# Patient Record
Sex: Female | Born: 1970 | Race: White | Hispanic: No | Marital: Married | Smoking: Never smoker
Health system: Southern US, Community
[De-identification: ages and names within clinical notes are randomized; demographics above are authoritative.]

## PROBLEM LIST (undated history)

## (undated) DIAGNOSIS — N939 Abnormal uterine and vaginal bleeding, unspecified: Secondary | ICD-10-CM

## (undated) DIAGNOSIS — R011 Cardiac murmur, unspecified: Secondary | ICD-10-CM

## (undated) DIAGNOSIS — Z950 Presence of cardiac pacemaker: Secondary | ICD-10-CM

## (undated) DIAGNOSIS — I472 Ventricular tachycardia, unspecified: Secondary | ICD-10-CM

## (undated) DIAGNOSIS — G43109 Migraine with aura, not intractable, without status migrainosus: Principal | ICD-10-CM

## (undated) DIAGNOSIS — G43909 Migraine, unspecified, not intractable, without status migrainosus: Secondary | ICD-10-CM

## (undated) DIAGNOSIS — F439 Reaction to severe stress, unspecified: Secondary | ICD-10-CM

## (undated) HISTORY — DX: Reaction to severe stress, unspecified: F43.9

## (undated) HISTORY — PX: ENDOMETRIAL ABLATION: SHX621

## (undated) HISTORY — DX: Ventricular tachycardia: I47.2

## (undated) HISTORY — DX: Ventricular tachycardia, unspecified: I47.20

## (undated) HISTORY — DX: Migraine with aura, not intractable, without status migrainosus: G43.109

## (undated) HISTORY — PX: DILATION AND CURETTAGE OF UTERUS: SHX78

## (undated) HISTORY — PX: LAPAROSCOPIC TOTAL HYSTERECTOMY: SUR800

## (undated) HISTORY — PX: WISDOM TOOTH EXTRACTION: SHX21

---

## 1995-07-23 HISTORY — PX: TUBAL LIGATION: SHX77

## 2006-08-08 ENCOUNTER — Other Ambulatory Visit: Admission: RE | Admit: 2006-08-08 | Discharge: 2006-08-08 | Payer: Self-pay | Admitting: Obstetrics & Gynecology

## 2007-11-04 ENCOUNTER — Other Ambulatory Visit: Admission: RE | Admit: 2007-11-04 | Discharge: 2007-11-04 | Payer: Self-pay | Admitting: Obstetrics & Gynecology

## 2011-08-28 ENCOUNTER — Encounter (HOSPITAL_COMMUNITY): Payer: Self-pay | Admitting: Pharmacist

## 2011-09-02 ENCOUNTER — Encounter (HOSPITAL_COMMUNITY): Payer: Self-pay

## 2011-09-02 ENCOUNTER — Encounter (HOSPITAL_COMMUNITY)
Admission: RE | Admit: 2011-09-02 | Discharge: 2011-09-02 | Disposition: A | Discharge status: Home / Self Care | Payer: Managed Care, Other (non HMO) | Source: Ambulatory Visit | Attending: Obstetrics & Gynecology | Admitting: Obstetrics & Gynecology

## 2011-09-02 HISTORY — DX: Abnormal uterine and vaginal bleeding, unspecified: N93.9

## 2011-09-02 LAB — SURGICAL PCR SCREEN: MRSA, PCR: NEGATIVE

## 2011-09-02 LAB — CBC
MCH: 31 pg (ref 26.0–34.0)
MCV: 92.1 fL (ref 78.0–100.0)
Platelets: 324 10*3/uL (ref 150–400)
RDW: 12.5 % (ref 11.5–15.5)
WBC: 7.3 10*3/uL (ref 4.0–10.5)

## 2011-09-02 NOTE — Patient Instructions (Signed)
   Your procedure is scheduled on: Tuesday, Feb 19th  Enter through the [employer] of [facility] at: [employer] up the phone at the desk and dial [phone] and inform us of your arrival.  Please call this number if you have any problems the morning of surgery: [phone]  Remember: Do not eat food after midnight:  Monday Do not drink clear liquids after:  Monday Take these medicines the morning of surgery with a SIP OF WATER: None  Do not wear jewelry, make-up, or FINGER nail polish Do not wear lotions, powders, perfumes or deodorant. Do not shave 48 hours prior to surgery. Do not bring valuables to the hospital.  Leave suitcase in the car. After Surgery it may be brought to your room. For patients being admitted to the hospital, checkout time is 11:00am the day of discharge. Home with Husband Trista Ciocca cell [phone]  Patients discharged on the day of surgery will not be allowed to drive home.     Remember to use your hibiclens as instructed.Please shower with 1/2 bottle the evening before your surgery and the other 1/2 bottle the morning of surgery.

## 2011-09-02 NOTE — Pre-Procedure Instructions (Signed)
Ok to see patient DOS. 

## 2011-09-09 MED ORDER — CEFAZOLIN SODIUM-DEXTROSE 2-3 GM-% IV SOLR
2.0000 g | INTRAVENOUS | Status: AC
  Administered 2011-09-10: 2 g via INTRAVENOUS
  Filled 2011-09-09: qty 50

## 2011-09-10 ENCOUNTER — Encounter (HOSPITAL_COMMUNITY): Payer: Self-pay | Admitting: *Deleted

## 2011-09-10 ENCOUNTER — Ambulatory Visit (HOSPITAL_COMMUNITY): Payer: Managed Care, Other (non HMO) | Admitting: Anesthesiology

## 2011-09-10 ENCOUNTER — Other Ambulatory Visit: Payer: Self-pay | Admitting: Obstetrics & Gynecology

## 2011-09-10 ENCOUNTER — Encounter (HOSPITAL_COMMUNITY): Payer: Self-pay | Admitting: Anesthesiology

## 2011-09-10 ENCOUNTER — Ambulatory Visit (HOSPITAL_COMMUNITY)
Admission: RE | Admit: 2011-09-10 | Discharge: 2011-09-10 | Disposition: A | Discharge status: Home / Self Care | Payer: Managed Care, Other (non HMO) | Source: Ambulatory Visit | Attending: Obstetrics & Gynecology | Admitting: Obstetrics & Gynecology

## 2011-09-10 ENCOUNTER — Encounter (HOSPITAL_COMMUNITY)
Admission: RE | Disposition: A | Discharge status: Home / Self Care | Payer: Self-pay | Source: Ambulatory Visit | Attending: Obstetrics & Gynecology

## 2011-09-10 DIAGNOSIS — N920 Excessive and frequent menstruation with regular cycle: Secondary | ICD-10-CM | POA: Diagnosis present

## 2011-09-10 DIAGNOSIS — L909 Atrophic disorder of skin, unspecified: Secondary | ICD-10-CM | POA: Insufficient documentation

## 2011-09-10 DIAGNOSIS — N938 Other specified abnormal uterine and vaginal bleeding: Secondary | ICD-10-CM | POA: Insufficient documentation

## 2011-09-10 DIAGNOSIS — N946 Dysmenorrhea, unspecified: Secondary | ICD-10-CM | POA: Diagnosis present

## 2011-09-10 DIAGNOSIS — N949 Unspecified condition associated with female genital organs and menstrual cycle: Secondary | ICD-10-CM | POA: Insufficient documentation

## 2011-09-10 HISTORY — PX: CYSTOSCOPY: SHX5120

## 2011-09-10 SURGERY — ROBOTIC ASSISTED TOTAL HYSTERECTOMY
Anesthesia: General | Site: Bladder | Wound class: Clean Contaminated

## 2011-09-10 MED ORDER — PROMETHAZINE HCL 25 MG/ML IJ SOLN: 12.5000 mg | INTRAMUSCULAR | Status: DC | PRN

## 2011-09-10 MED ORDER — ROCURONIUM BROMIDE 50 MG/5ML IV SOLN
INTRAVENOUS | Status: AC
  Filled 2011-09-10: qty 2

## 2011-09-10 MED ORDER — INDIGOTINDISULFONATE SODIUM 8 MG/ML IJ SOLN
INTRAMUSCULAR | Status: DC | PRN
  Administered 2011-09-10: 5 mL via INTRAVENOUS

## 2011-09-10 MED ORDER — LIDOCAINE HCL (CARDIAC) 20 MG/ML IV SOLN
INTRAVENOUS | Status: DC | PRN
  Administered 2011-09-10: 50 mg via INTRAVENOUS

## 2011-09-10 MED ORDER — NEOSTIGMINE METHYLSULFATE 1 MG/ML IJ SOLN
INTRAMUSCULAR | Status: DC | PRN
  Administered 2011-09-10: 4 mg via INTRAVENOUS

## 2011-09-10 MED ORDER — FENTANYL CITRATE 0.05 MG/ML IJ SOLN
INTRAMUSCULAR | Status: DC | PRN
  Administered 2011-09-10: 100 ug via INTRAVENOUS
  Administered 2011-09-10: 50 ug via INTRAVENOUS
  Administered 2011-09-10: 100 ug via INTRAVENOUS
  Administered 2011-09-10: 50 ug via INTRAVENOUS
  Administered 2011-09-10: 100 ug via INTRAVENOUS

## 2011-09-10 MED ORDER — FENTANYL CITRATE 0.05 MG/ML IJ SOLN
INTRAMUSCULAR | Status: AC
  Filled 2011-09-10: qty 5

## 2011-09-10 MED ORDER — HYDROMORPHONE HCL PF 1 MG/ML IJ SOLN
INTRAMUSCULAR | Status: AC
  Filled 2011-09-10: qty 1

## 2011-09-10 MED ORDER — KETOROLAC TROMETHAMINE 30 MG/ML IJ SOLN: 30.0000 mg | Freq: Four times a day (QID) | INTRAMUSCULAR | Status: DC

## 2011-09-10 MED ORDER — GLYCOPYRROLATE 0.2 MG/ML IJ SOLN
INTRAMUSCULAR | Status: DC | PRN
  Administered 2011-09-10: .8 mg via INTRAVENOUS

## 2011-09-10 MED ORDER — PHENYLEPHRINE HCL 10 MG/ML IJ SOLN: INTRAMUSCULAR | Status: DC | PRN

## 2011-09-10 MED ORDER — STERILE WATER FOR IRRIGATION IR SOLN
Status: DC | PRN
  Administered 2011-09-10: 1000 mL via INTRAVESICAL

## 2011-09-10 MED ORDER — LACTATED RINGERS IV SOLN
INTRAVENOUS | Status: DC
  Administered 2011-09-10: 10:00:00 via INTRAVENOUS
  Administered 2011-09-10: 125 mL/h via INTRAVENOUS

## 2011-09-10 MED ORDER — DEXAMETHASONE SODIUM PHOSPHATE 10 MG/ML IJ SOLN
INTRAMUSCULAR | Status: AC
  Filled 2011-09-10: qty 1

## 2011-09-10 MED ORDER — PROPOFOL 10 MG/ML IV EMUL
INTRAVENOUS | Status: AC
  Filled 2011-09-10: qty 20

## 2011-09-10 MED ORDER — ROCURONIUM BROMIDE 100 MG/10ML IV SOLN
INTRAVENOUS | Status: DC | PRN
  Administered 2011-09-10: 20 mg via INTRAVENOUS
  Administered 2011-09-10: 10 mg via INTRAVENOUS
  Administered 2011-09-10: 50 mg via INTRAVENOUS

## 2011-09-10 MED ORDER — ACETAMINOPHEN 325 MG PO TABS: 650.0000 mg | ORAL_TABLET | ORAL | Status: DC | PRN

## 2011-09-10 MED ORDER — GLYCOPYRROLATE 0.2 MG/ML IJ SOLN
INTRAMUSCULAR | Status: AC
  Filled 2011-09-10: qty 1

## 2011-09-10 MED ORDER — ROPIVACAINE HCL 5 MG/ML IJ SOLN
INTRAMUSCULAR | Status: AC
  Filled 2011-09-10: qty 60

## 2011-09-10 MED ORDER — MIDAZOLAM HCL 5 MG/5ML IJ SOLN
INTRAMUSCULAR | Status: DC | PRN
  Administered 2011-09-10: 2 mg via INTRAVENOUS

## 2011-09-10 MED ORDER — PANTOPRAZOLE SODIUM 40 MG IV SOLR
40.0000 mg | Freq: Every day | INTRAVENOUS | Status: DC
  Filled 2011-09-10: qty 40

## 2011-09-10 MED ORDER — EPHEDRINE 5 MG/ML INJ
INTRAVENOUS | Status: AC
  Filled 2011-09-10: qty 10

## 2011-09-10 MED ORDER — LACTATED RINGERS IR SOLN
Status: DC | PRN
  Administered 2011-09-10: 3000 mL

## 2011-09-10 MED ORDER — EPHEDRINE SULFATE 50 MG/ML IJ SOLN
INTRAMUSCULAR | Status: DC | PRN
  Administered 2011-09-10: 10 mg via INTRAVENOUS

## 2011-09-10 MED ORDER — ONDANSETRON HCL 4 MG/2ML IJ SOLN
INTRAMUSCULAR | Status: DC | PRN
  Administered 2011-09-10: 4 mg via INTRAVENOUS

## 2011-09-10 MED ORDER — PROMETHAZINE HCL 25 MG/ML IJ SOLN: 6.2500 mg | INTRAMUSCULAR | Status: DC | PRN

## 2011-09-10 MED ORDER — NEOSTIGMINE METHYLSULFATE 1 MG/ML IJ SOLN
INTRAMUSCULAR | Status: AC
  Filled 2011-09-10: qty 10

## 2011-09-10 MED ORDER — GLYCOPYRROLATE 0.2 MG/ML IJ SOLN
INTRAMUSCULAR | Status: AC
  Filled 2011-09-10: qty 2

## 2011-09-10 MED ORDER — TEMAZEPAM 15 MG PO CAPS: 15.0000 mg | ORAL_CAPSULE | Freq: Every evening | ORAL | Status: DC | PRN

## 2011-09-10 MED ORDER — HYDROMORPHONE HCL PF 1 MG/ML IJ SOLN
INTRAMUSCULAR | Status: DC | PRN
  Administered 2011-09-10: 1 mg via INTRAVENOUS

## 2011-09-10 MED ORDER — PROPOFOL 10 MG/ML IV EMUL
INTRAVENOUS | Status: DC | PRN
  Administered 2011-09-10: 200 mg via INTRAVENOUS

## 2011-09-10 MED ORDER — NALOXONE HCL 0.4 MG/ML IJ SOLN
INTRAMUSCULAR | Status: AC
  Filled 2011-09-10: qty 1

## 2011-09-10 MED ORDER — ROCURONIUM BROMIDE 50 MG/5ML IV SOLN
INTRAVENOUS | Status: AC
  Filled 2011-09-10: qty 1

## 2011-09-10 MED ORDER — DEXAMETHASONE SODIUM PHOSPHATE 4 MG/ML IJ SOLN
INTRAMUSCULAR | Status: DC | PRN
  Administered 2011-09-10: 10 mg via INTRAVENOUS

## 2011-09-10 MED ORDER — FLUMAZENIL 1 MG/10ML IV SOLN
INTRAVENOUS | Status: AC
  Filled 2011-09-10: qty 10

## 2011-09-10 MED ORDER — MIDAZOLAM HCL 2 MG/2ML IJ SOLN
INTRAMUSCULAR | Status: AC
  Filled 2011-09-10: qty 2

## 2011-09-10 MED ORDER — MORPHINE SULFATE 4 MG/ML IJ SOLN: 1.0000 mg | INTRAMUSCULAR | Status: DC | PRN

## 2011-09-10 MED ORDER — BUPIVACAINE HCL (PF) 0.25 % IJ SOLN
INTRAMUSCULAR | Status: AC
  Filled 2011-09-10: qty 30

## 2011-09-10 MED ORDER — ONDANSETRON HCL 4 MG/2ML IJ SOLN
INTRAMUSCULAR | Status: AC
  Filled 2011-09-10: qty 2

## 2011-09-10 MED ORDER — ACETAMINOPHEN 325 MG PO TABS: 325.0000 mg | ORAL_TABLET | ORAL | Status: DC | PRN

## 2011-09-10 MED ORDER — MENTHOL 3 MG MT LOZG: 1.0000 | LOZENGE | OROMUCOSAL | Status: DC | PRN

## 2011-09-10 MED ORDER — METOCLOPRAMIDE HCL 5 MG/ML IJ SOLN
INTRAMUSCULAR | Status: AC
  Filled 2011-09-10: qty 2

## 2011-09-10 MED ORDER — ROPIVACAINE HCL 5 MG/ML IJ SOLN
INTRAMUSCULAR | Status: DC | PRN
  Administered 2011-09-10: 69 mL

## 2011-09-10 MED ORDER — KETOROLAC TROMETHAMINE 30 MG/ML IJ SOLN
15.0000 mg | Freq: Once | INTRAMUSCULAR | Status: AC | PRN
  Administered 2011-09-10: 30 mg via INTRAVENOUS

## 2011-09-10 MED ORDER — INDIGOTINDISULFONATE SODIUM 8 MG/ML IJ SOLN
INTRAMUSCULAR | Status: AC
  Filled 2011-09-10: qty 5

## 2011-09-10 MED ORDER — FENTANYL CITRATE 0.05 MG/ML IJ SOLN: 25.0000 ug | INTRAMUSCULAR | Status: DC | PRN

## 2011-09-10 MED ORDER — DEXTROSE-NACL 5-0.45 % IV SOLN: INTRAVENOUS | Status: DC

## 2011-09-10 MED ORDER — PHENYLEPHRINE 40 MCG/ML (10ML) SYRINGE FOR IV PUSH (FOR BLOOD PRESSURE SUPPORT)
PREFILLED_SYRINGE | INTRAVENOUS | Status: AC
  Filled 2011-09-10: qty 5

## 2011-09-10 MED ORDER — KETOROLAC TROMETHAMINE 30 MG/ML IJ SOLN
INTRAMUSCULAR | Status: AC
  Filled 2011-09-10: qty 1

## 2011-09-10 MED ORDER — OXYCODONE-ACETAMINOPHEN 5-325 MG PO TABS: 1.0000 | ORAL_TABLET | ORAL | Status: DC | PRN

## 2011-09-10 MED ORDER — LIDOCAINE HCL (CARDIAC) 20 MG/ML IV SOLN
INTRAVENOUS | Status: AC
  Filled 2011-09-10: qty 5

## 2011-09-10 SURGICAL SUPPLY — 55 items
APL SKNCLS STERI-STRIP NONHPOA (GAUZE/BANDAGES/DRESSINGS)
BAG URINE DRAINAGE (UROLOGICAL SUPPLIES) ×4 IMPLANT
BARRIER ADHS 3X4 INTERCEED (GAUZE/BANDAGES/DRESSINGS) ×4 IMPLANT
BENZOIN TINCTURE PRP APPL 2/3 (GAUZE/BANDAGES/DRESSINGS) ×3 IMPLANT
BRR ADH 4X3 ABS CNTRL BYND (GAUZE/BANDAGES/DRESSINGS) ×3
CABLE HIGH FREQUENCY MONO STRZ (ELECTRODE) ×4 IMPLANT
CATH FOLEY 3WAY  5CC 16FR (CATHETERS) ×1
CATH FOLEY 3WAY 5CC 16FR (CATHETERS) ×3 IMPLANT
CLOTH BEACON ORANGE TIMEOUT ST (SAFETY) ×4 IMPLANT
CONT PATH 16OZ SNAP LID 3702 (MISCELLANEOUS) ×4 IMPLANT
COVER MAYO STAND STRL (DRAPES) ×4 IMPLANT
COVER TABLE BACK 60X90 (DRAPES) ×8 IMPLANT
COVER TIP SHEARS 8 DVNC (MISCELLANEOUS) ×3 IMPLANT
COVER TIP SHEARS 8MM DA VINCI (MISCELLANEOUS) ×1
DECANTER SPIKE VIAL GLASS SM (MISCELLANEOUS) ×4 IMPLANT
DRAPE HUG U DISPOSABLE (DRAPE) ×4 IMPLANT
DRAPE LG THREE QUARTER DISP (DRAPES) ×8 IMPLANT
DRAPE MONITOR DA VINCI (DRAPE) ×1 IMPLANT
DRAPE WARM FLUID 44X44 (DRAPE) ×4 IMPLANT
ELECT REM PT RETURN 9FT ADLT (ELECTROSURGICAL) ×4
ELECTRODE REM PT RTRN 9FT ADLT (ELECTROSURGICAL) ×3 IMPLANT
EVACUATOR SMOKE 8.L (FILTER) ×4 IMPLANT
GLOVE BIOGEL PI IND STRL 7.0 (GLOVE) ×6 IMPLANT
GLOVE BIOGEL PI INDICATOR 7.0 (GLOVE) ×4
GLOVE ECLIPSE 6.5 STRL STRAW (GLOVE) ×14 IMPLANT
GOWN STRL REIN XL XLG (GOWN DISPOSABLE) ×24 IMPLANT
KIT ACCESSORY DA VINCI DISP (KITS) ×1
KIT ACCESSORY DVNC DISP (KITS) ×3 IMPLANT
NDL INSUFFLATION 14GA 120MM (NEEDLE) ×3 IMPLANT
NEEDLE INSUFFLATION 14GA 120MM (NEEDLE) ×4 IMPLANT
NS IRRIG 1000ML POUR BTL (IV SOLUTION) ×12 IMPLANT
OCCLUDER COLPOPNEUMO (BALLOONS) ×1 IMPLANT
PACK LAVH (CUSTOM PROCEDURE TRAY) ×4 IMPLANT
PAD PREP 24X48 CUFFED NSTRL (MISCELLANEOUS) ×8 IMPLANT
PLUG CATH AND CAP STER (CATHETERS) ×4 IMPLANT
PROTECTOR NERVE ULNAR (MISCELLANEOUS) ×8 IMPLANT
SET CYSTO W/LG BORE CLAMP LF (SET/KITS/TRAYS/PACK) ×4 IMPLANT
SET IRRIG TUBING LAPAROSCOPIC (IRRIGATION / IRRIGATOR) ×4 IMPLANT
SOLUTION ELECTROLUBE (MISCELLANEOUS) ×4 IMPLANT
STRIP CLOSURE SKIN 1/4X4 (GAUZE/BANDAGES/DRESSINGS) ×4 IMPLANT
SUT VIC AB 0 CT1 27 (SUTURE) ×8
SUT VIC AB 0 CT1 27XBRD ANBCTR (SUTURE) ×15 IMPLANT
SUT VICRYL 0 UR6 27IN ABS (SUTURE) ×5 IMPLANT
SUT VICRYL RAPIDE 4/0 PS 2 (SUTURE) ×8 IMPLANT
SUT VLOC 180 0 9IN  GS21 (SUTURE) ×1
SUT VLOC 180 0 9IN GS21 (SUTURE) ×1 IMPLANT
SYR 50ML LL SCALE MARK (SYRINGE) ×4 IMPLANT
TIP UTERINE 6.7X8CM BLUE DISP (MISCELLANEOUS) ×1 IMPLANT
TOWEL OR 17X24 6PK STRL BLUE (TOWEL DISPOSABLE) ×8 IMPLANT
TROCAR DISP BLADELESS 8 DVNC (TROCAR) ×3 IMPLANT
TROCAR DISP BLADELESS 8MM (TROCAR) ×1
TROCAR XCEL NON-BLD 5MMX100MML (ENDOMECHANICALS) ×4 IMPLANT
TROCAR Z-THREAD 12X150 (TROCAR) ×4 IMPLANT
TUBING FILTER THERMOFLATOR (ELECTROSURGICAL) ×4 IMPLANT
WARMER LAPAROSCOPE (MISCELLANEOUS) ×4 IMPLANT

## 2011-09-10 NOTE — Anesthesia Procedure Notes (Signed)
Procedure Name: Intubation Date/Time: 09/10/2011 7:19 AM Performed by: Madison Hickman Pre-anesthesia Checklist: Patient identified, Emergency Drugs available, Suction available, Timeout performed and Patient being monitored Patient Re-evaluated:Patient Re-evaluated prior to inductionOxygen Delivery Method: Circle System Utilized Preoxygenation: Pre-oxygenation with 100% oxygen Intubation Type: IV induction Ventilation: Mask ventilation without difficulty Laryngoscope Size: Mac and 3 Grade View: Grade IV Tube type: Oral Tube size: 7.0 mm Number of attempts: 3 Airway Equipment and Method: stylet and video-laryngoscopy Placement Confirmation: ETT inserted through vocal cords under direct vision,  positive ETCO2 and breath sounds checked- equal and bilateral Secured at: 20 cm Tube secured with: Tape Dental Injury: Teeth and Oropharynx as per pre-operative assessment  Difficulty Due To: Difficult Airway- due to limited oral opening and Difficulty was unanticipated Comments: DL with Mac 3 blade, no visualization of cords, Dr. Malen Gauze DL with Hyacinth Meeker 2 with limited visualization of cords, patient easy mask ventilation, 02 Sat remaining 97-100% during intubation process. DL by Dr. Malen Gauze with glidescope #3 blade, with easy, atraumatic intubation. +ETCO2, + = bilateral breath sounds. ETT taped in place.

## 2011-09-10 NOTE — H&P (Signed)
Erika [name] is an 41 y.o. female G3P2 MWF with DUB, menorrhagia, and dysmenorrhea who has decided to proceed with definitive management.  Patient underwent a Novasure ablation in 2011 and although her bleeding is some better, the bleeding is quite irregular now and she has severe cramping and back pain with her cycles.  She has been counseled about other options, risks/benefits, and has decided to proceed with robotic assisted TLH/poss BSO/cystoscopy.  Pertinent Gynecological History: Menses: as above Bleeding: dysfunctional uterine bleeding Contraception: tubal ligation DES exposure: denies Blood transfusions: none Sexually transmitted diseases: no past history Previous GYN Procedures: novasure ablation  Last mammogram: never had one--age 18 Date: N/A Last pap: normal Date: 7/12 OB History: G3, P2   Menstrual History: Menarche age: 71 No LMP recorded.    Past Medical History  Diagnosis Date  . Headache     history, otc med prn, last one 2 yrs ago  . Abnormal uterine bleeding     Past Surgical History  Procedure Date  . Dilation and curettage of uterus   . Cesarean section     x 2  . Wisdom tooth extraction   . Tubal ligation   . Endometrial ablation     failed - done in MD office    History reviewed. No pertinent family history.  Social History:  reports that she has never smoked. She has never used smokeless tobacco. She reports that she drinks alcohol. She reports that she does not use illicit drugs.  Allergies: No Known Allergies  Prescriptions prior to admission  Medication Sig Dispense Refill  . ibuprofen (ADVIL,MOTRIN) 200 MG tablet Take 400 mg by mouth every 6 (six) hours as needed.        Review of Systems  Constitutional: Negative for fever and chills.  Respiratory: Negative for cough.   Cardiovascular: Negative for chest pain and palpitations.  Gastrointestinal: Negative for heartburn, nausea and vomiting.  Genitourinary: Negative for dysuria.    Musculoskeletal: Negative for myalgias.  Skin: Negative for rash.  Neurological: Negative for dizziness and headaches.  Endo/Heme/Allergies: Does not bruise/bleed easily.  Psychiatric/Behavioral: Negative for depression.    Blood pressure 124/69, pulse 85, temperature 98.2 F (36.8 C), temperature source Oral, resp. rate 18, SpO2 100.00%. Physical Exam  Vitals reviewed. Constitutional: She is oriented to person, place, and time. She appears well-developed and well-nourished.  HENT:  Head: Normocephalic and atraumatic.  Neck: Normal range of motion. Neck supple.  Cardiovascular: Normal rate, regular rhythm and normal heart sounds.   Respiratory: Effort normal and breath sounds normal.  GI: Soft. Bowel sounds are normal.  Genitourinary: Vagina normal and uterus normal.  Musculoskeletal: Normal range of motion.  Neurological: She is alert and oriented to person, place, and time.  Skin: Skin is warm and dry.    Results for orders placed during the hospital encounter of 09/10/11 (from the past 24 hour(s))  PREGNANCY, URINE     Status: Normal   Collection Time   09/10/11  5:45 AM      Component Value Range   Preg Test, Ur NEGATIVE  NEGATIVE     No results found.  Assessment/Plan: 41 year old MWF here for TLH/possible BSO/cystocopy.  All questions answered.  Patient ready to proceed.  Sherrell Farish,M SUZANNE 09/10/2011, 6:58 AM

## 2011-09-10 NOTE — Transfer of Care (Signed)
Immediate Anesthesia Transfer of Care Note  Patient: Erika [name]  Procedure(s) Performed: Procedure(s) (LRB): ROBOTIC ASSISTED TOTAL HYSTERECTOMY (N/A) BILATERAL SALPINGECTOMY (Bilateral)  Patient Location: PACU  Anesthesia Type: General  Level of Consciousness: awake, alert  and sedated  Airway & Oxygen Therapy: Patient Spontanous Breathing and Patient connected to nasal cannula oxygen  Post-op Assessment: Report given to PACU RN and Post -op Vital signs reviewed and stable  Post vital signs: Reviewed and stable  Complications: No apparent anesthesia complications

## 2011-09-10 NOTE — Anesthesia Postprocedure Evaluation (Signed)
  Anesthesia Post-op Note  Patient: Erika [name]  Procedure(s) Performed: Procedure(s) (LRB): ROBOTIC ASSISTED TOTAL HYSTERECTOMY (N/A) BILATERAL SALPINGECTOMY (Bilateral)  Patient Location: PACU  Anesthesia Type: General  Level of Consciousness: awake, alert  and oriented  Airway and Oxygen Therapy: Patient Spontanous Breathing  Post-op Pain: none  Post-op Assessment: Post-op Vital signs reviewed, Patient's Cardiovascular Status Stable, Respiratory Function Stable, Patent Airway, No signs of Nausea or vomiting and Pain level controlled  Post-op Vital Signs: Reviewed and stable  Complications: No apparent anesthesia complications

## 2011-09-10 NOTE — Discharge Summary (Signed)
Physician Discharge Summary  Patient ID: Erika [name] MRN: [id number] DOB/AGE: 28-Jul-1970 41 y.o.  Admit date: 09/10/2011 Discharge date: 09/10/2011  Admission Diagnoses:menorrhagia, dysmenorrhea, DUB, history of cesarean section x 2, history of BTL, failed Novasure abaltion Discharge Diagnoses:  Active Problems:  * No active hospital problems. *    Discharged Condition: good  Hospital Course: Patient admitted through same day surgery.  Robotic TLH/bilateral salpingectomy/cystoscopy/vulvar mole removal was performed.  EBL was ~100cc.  Patient made good UOP.  From the operating room, she was transferred to the PACU, and then to the third floor.  Patient was able to void without difficulty.  She was advanced to regular diet.  Her IV was hep-locked.  She required no post-op pain meds.  She was able to ambulate without difficulty.  She was seen in the pm of POD#1.  She had met all criteria for discharge.  Decision was made to let her go home and not keep her overnight.   Consults: None  Significant Diagnostic Studies: none  Treatments: surgery: robotic assisted TLH/bilateral salpingectomy/cystoscopy/mole removal  Discharge Exam: Blood pressure 109/71, pulse 109, temperature 98.2 F (36.8 C), temperature source Oral, resp. rate 17, height 5\' 3"  (1.6 m), weight 86.637 kg (191 lb), SpO2 96.00%. General appearance: alert and cooperative Resp: clear to auscultation bilaterally Cardio: regular rate and rhythm, S1, S2 normal, no murmur, click, rub or gallop GI: soft, non-tender; bowel sounds normal; no masses,  no organomegaly Extremities: extremities normal, atraumatic, no cyanosis or edema Incision/Wound:clean/dry/intact  Disposition: Final discharge disposition not confirmed   Medication List  As of 09/10/2011  7:06 PM   TAKE these medications         ibuprofen 200 MG tablet   Commonly known as: ADVIL,MOTRIN   Take 400 mg by mouth every 6 (six) hours as needed.            Follow-up Information    Follow up with Lum Keas, MD in 1 week. (Patient has appointment scheduled already)    Contact information:   [address] [place] [address] [phone]          Signed: Lum Keas 09/10/2011, 7:06 PM

## 2011-09-10 NOTE — Anesthesia Preprocedure Evaluation (Signed)
Anesthesia Evaluation  Patient identified by MRN, date of birth, ID band Patient awake    Reviewed: Allergy & Precautions, H&P , Patient's Chart, lab work & pertinent test results, reviewed documented beta blocker date and time   History of Anesthesia Complications Negative for: history of anesthetic complications  Airway Mallampati: II TM Distance: >3 FB Neck ROM: full    Dental No notable dental hx.    Pulmonary neg pulmonary ROS,  clear to auscultation  Pulmonary exam normal       Cardiovascular Exercise Tolerance: Good neg cardio ROS regular Normal    Neuro/Psych Negative Neurological ROS  Negative Psych ROS   GI/Hepatic negative GI ROS, Neg liver ROS,   Endo/Other  Negative Endocrine ROS  Renal/GU negative Renal ROS     Musculoskeletal   Abdominal   Peds  Hematology negative hematology ROS (+)   Anesthesia Other Findings   Reproductive/Obstetrics negative OB ROS                           Anesthesia Physical Anesthesia Plan  ASA: II  Anesthesia Plan: General ETT   Post-op Pain Management:    Induction:   Airway Management Planned:   Additional Equipment:   Intra-op Plan:   Post-operative Plan:   Informed Consent: I have reviewed the patients History and Physical, chart, labs and discussed the procedure including the risks, benefits and alternatives for the proposed anesthesia with the patient or authorized representative who has indicated his/her understanding and acceptance.   Dental Advisory Given  Plan Discussed with: CRNA and Surgeon  Anesthesia Plan Comments:         Anesthesia Quick Evaluation  

## 2011-09-10 NOTE — Progress Notes (Signed)
Day of Surgery Procedure(s) (LRB): ROBOTIC ASSISTED TOTAL HYSTERECTOMY (N/A) BILATERAL SALPINGECTOMY (Bilateral) CYSTOSCOPY (N/A)  Subjective: Patient reports tolerating PO.  She has no nausea.  She has taken nothing for pain.  She has walked the halls several times.  She is voiding without difficulty.  Objective: I have reviewed patient's vital signs, intake and output and medications.  General: alert and cooperative Resp: clear to auscultation bilaterally Cardio: regular rate and rhythm, S1, S2 normal, no murmur, click, rub or gallop GI: soft, non-tender; bowel sounds normal; no masses,  no organomegaly and incision: clean, dry and intact Extremities: extremities normal, atraumatic, no cyanosis or edema Vaginal Bleeding: minimal Incision on mons is without bleeding  Assessment: s/p Procedure(s) (LRB): ROBOTIC ASSISTED TOTAL HYSTERECTOMY (N/A) BILATERAL SALPINGECTOMY (Bilateral) CYSTOSCOPY (N/A): stable and progressing well  Plan: Discharge home  LOS: 0 days    Jamahl Lemmons,M SUZANNE 09/10/2011, 6:59 PM

## 2011-09-10 NOTE — Discharge Instructions (Signed)
Post Op Hysterectomy Instructions Please read the instructions below. Refer to these instructions for the next few weeks.  Also refer to the brochure Dr. Hyacinth Meeker gave you in the office regarding post-op care. If you have any problems or questions after you leave, please call Dr Rondel Baton office--[phone].  If you have a question after hours or during the weekend, please call the office and on the message will tell you how to reach the on-call doctor.  HOME CARE INSTRUCTIONS Healing will take time. You will have discomfort, tenderness, swelling and bruising at the operative site for a couple of weeks. This is normal and will get better as time goes on.   Only take over-the-counter or prescription medicines for pain, discomfort or fever as directed by your caregiver.   Do not take aspirin. It can cause bleeding.   Do not drive when taking pain medication.   Follow Dr. Rondel Baton advice regarding diet, exercise, lifting, driving and general activities.   Resume your usual diet as directed and allowed.   Get plenty of rest and sleep.   Do not douche, use tampons, or have sexual intercourse until your caregiver gives you permission. .   Take your temperature if you feel hot or flushed.   You may shower today when you get home.  No tub bath for one week.    Do not drink alcohol until you are not taking any narcotic pain medications.   Try to have someone home with you for a week or two to help with the household activities.   Be careful over the next two to three weeks with any activities at home that involve lifting, pushing, or pulling.  Listen to your body--if something feels uncomfortable to do, then don't do it.  Make sure you and your family understands everything about your operation and recovery.   Walking up stairs is fine.  Do not sign any legal documents until you feel normal again.   Keep all your follow-up appointments as recommended by your caregiver.   PLEASE CALL THE OFFICE  IF:  There is swelling, redness or increasing pain in the wound area.   Pus is coming from the wound.   You notice a bad smell from the wound or surgical dressing.   You have pain, redness and swelling from the intravenous site.   The wound is breaking open (the edges are not staying together).    You develop pain or bleeding when you urinate.   You develop abnormal vaginal discharge.   You have any type of abnormal reaction or develop an allergy to your medication.   You need stronger pain medication for your pain   SEEK IMMEDIATE MEDICAL CARE:  You develop a temperature of 100.5 or higher.   You develop abdominal pain.   You develop chest pain.   You develop shortness of breath.   You pass out.   You develop pain, swelling or redness of your leg.   You develop heavy vaginal bleeding with or without blood clots.   MEDICATIONS:  Restart your regular medications BUT wait one week before restarting all vitamins and mineral supplements  Use Motrin 800mg  every 8 hours for the next several days.  This will help you use less Percocet.  Use the Percocet 5/325 1-2 tabs every 4-6 hours as needed for pain.  You may use an over the counter stool softener like Colace or Dulcolax to help with starting a bowel movement.  Start the day after you go home.  Warm liquids, fluids, and ambulation help too.  If you have not had a bowel movement in four days, you need to call the office.

## 2011-09-10 NOTE — Op Note (Signed)
09/10/2011  10:13 AM  PATIENT:  Erika [name]  41 y.o. female G3P2 MWF with menorrhagia, dysfunctional uterine bleeding, dysmenorrhea, failed Novasure ablation, desirous of definitive treatment  PRE-OPERATIVE DIAGNOSIS:  Menorrhagia, Failed Novasure, DUB, dysmenorrhea, vulvar mole, history of cesarean section x 2, h/o BTL  POST-OPERATIVE DIAGNOSIS:  Menorrhagia, Failed Novasure, DUB, dysmenorrhea, vulvar mole, history of cesarean section x 2, significant adhesions along the lower uterine segment secondary to prior Cesarean sections  PROCEDURE:  Procedure(s): ROBOTIC ASSISTED TOTAL HYSTERECTOMY BILATERAL SALPINGECTOMY CYSTOSCOPY EXCISION OF MOLE ON LEFT MONS PUBIS  SURGEON:  Emmalin Jaquess,M SUZANNE  ASSISTANTS: ROMINE, CYNTHIA   ANESTHESIA:   general  ESTIMATED BLOOD LOSS: * No blood loss amount entered *  BLOOD ADMINISTERED:none   FLUIDS: 1200ccLR  UOP: 200cc clear  SPECIMEN:  Uterus, cervix, bilateral fallopian tubes, left vulvar mole  DISPOSITION OF SPECIMEN:  PATHOLOGY  FINDINGS: Normal appearing uterus, history of tubal ligation with scarring of tubes, significant adhesions along the lower uterine segment from prior cesarean sections, normal upper abdomen except for a few adhesions of the small bowel along the mid portion of the abdomen on the right  DESCRIPTION OF OPERATION: Patient was taken to the operating room and she was placed in the supine position. Arms were tucked by the side. She was comfortable in this position. General endotracheal anesthesia was administered by the anesthesia staff without difficulty. Dr. foster oversaw case. Rosalita Chessman was our CRNA for the case. Legs are lifted in the low lithotomy position. SCDs were on the lower extremities bilaterally. The abdomen was prepped with chlor prep and the perineum, inner thighs and vagina were prepped with Betadine prep x3. Once 3 minutes had passed, the patient was draped in a normal standard fashion. Legs are lifted to the  high lithotomy position and attention was turned vagina.  A bivalve speculum was used to visualize the cervix. The anterior lip of the cervix was grasped with a single-tooth tenaculum. An attempt was made soundly uterus. The patient did have a history of a prior NovaSure ablation and I did expection cauterization of the endometrial cavity. It was difficult to pass the sound but with some pressure the sound passed easily. It appeared that the uterus sounded to 12 cm which should be too big based on the preoperative ultrasound was performed. I felt at this point a probable perforated the uterus. There was no active bleeding. Using [facility] dilators the cervix was dilated up to #21. A #8 disposable tip was obtained for the RUMI uterine manipulator. This was attached to the manipulator as well as a vaginal occlusive device and finally a medium KOH ring. This device was passed to the cervical os and the balloon at the disposable tip was inflated. The KOH ring was held in place on the cervix with a stitch on the anterior lip of the cervix at 12:00. The tenaculum was removed and the bivalve speculum was removed. A Foley catheter was placed into the bladder to straight drain. Clear urine was noted.   Legs were lowered to the low lithotomy position.  0.5% ropivacaine mixed one-to-one with normal saline was used as the topical anesthetic agent. This was used to anesthetize in the umbilicus. Skin was nicked. A Veress needle was passed into the abdomen, with the abdomen elevated, and the polyp was noted when the peritoneum was traversed. The syringe of normal saline was obtained. This was attached to the needle and aspiration was performed. No blood or fluid was noted. Fluid dripped easily into the  needle. A final aspiration was performed. No blood, fluid, or saline was noted. Then under low flow of CO2 gas a pneumoperitoneum was achieved. Once 3 and half liters of gas was in the abdomen a veress needle was removed. Then 2  centimeters beneath the patient's ribs , on the left side, in the midclavicular line, the skin was nicked with a #11 blade at the skin was anesthetized with ropivacaine mixture. Using a 5 mm diagnostic laparoscope attached to an Optiview port, instrument was passed through the abdominal wall. The pelvis was surveyed. There was a perforation in the uterus but it was high on the fundus and there was minimal bleeding noted. No significant adhesions noted in the prior cesarean sections were performed. The ovaries and fallopian tubes appeared normal and were freely mobile. The tubes were somewhat clubbed and did have scarring consistent with prior tubal ligation. At this point the midline incision was extended to about 10 mm and a direct visualization of the laparoscope a 12 mm bladed trocar port were passed without difficulty. Trocar was removed. And then 10 cm lateral to this incision, skin was transilluminated and the skin was anesthetized with ropivacaine mixture and nicked with a #11 blade. 8mm skin incisions were made. Under direct visualization of the laparoscope 8mm ports were placed on the right and the left side of the umbilicus. These were to be used for arm 1 in arm 2 of the robot. 60 cc of the ropivacaine mixture was used to bathe the pelvis while the ports were being placed and help with pain control during the surgery. This was left in the pelvis and are removed until the very end of the procedure  The table was placed on the floor and the patient was placed in Trendelenburg until the bowel slipped out of the pelvis. Steep Trendelenburg was not necessary. The robot was side docked on the right side in a normal standard fashion. In arm 1 was placed and endoscopic scissor with monopolar cautery attached and in arm 2 was placed [place] with bipolar cautery attached. The midline port was used for the camera and the left upper quadrant port was used for the assistant. At this point I left the bedside and  sat at the console for the surgical portion of the procedure.  The right tube was elevated and incised along the mesosalpinx the right tube was freed left attached to the specimen to be removed and the specimen was removed. The utero-ovarian pedicle was serially clamped cauterized and incised on the right side. The ureter had been identified. The right round ligament was serially clamped cauterized and incised. The posterior aspect of the peritoneum was opened at this point down to the level of the uterosacral ligament. The anterior peritoneum was opened but there was significant adhesions of the prior C-section scar. With careful sharp dissection, and staying close to the uterus the adhesions from the bladder was slowly and carefully taken down the white glistening tissue of the cervix was identified and location of the KOH ring was noted. This incision was carried along to the midline until it did not feel safe continuing the dissection from the side. The right uterine artery was skeletonized and it was cauterized just above the KOH ring on the right side. This was then incised, the vascularizing right side the uterus.  Attention was turned to the left side. The ureter was noted. In a similar fashion the left tube was elevated and the mesosalpinx was incised, freeing the  2 except at its attachment point the uterus. The utero-ovarian pedicle on the left side was serially clamped cauterized and incised. The left round layer was serially clamped cauterized and incised. The posterior peritoneum was opened down to the uterosacral ligament on the left side. And then the most significant area of adhesion was carefully and slowly dissected with sharp dissection. Port from both the midline region and the left side above the uterine artery until the adhesions were free and the bladder was well out of the way. Total dissection time of the bladder from the anterior aspect of the uterus and cervix was approximately 45  minutes. The uterine artery on the left side this skeletonized and then serially clamped cauterized and incised above the KOH ring. At this point the uterus was devascularize to purple in color.  The colpotomy was performed a starting in the midline and cutting with the open edge of the monopolar scissors. The blue edge of the KOH ring was noted this dissection was carried around the cervix in a counterclockwise fashion. Once the cervix was freed from the vagina the uterus cervix and tubes were delivered to the vaginal incision. The instruments were changed and an arm 1 suture cut needle driver was placed and in arm 2 was placed needle nose grasper. Using 180 day of V lock suture, the vaginal cuff was closed the starting at the right angle and working across in a running fashion to the left angle. The suture was brought back for 2 additional stitches, lifted and cut flush at the vaginal mucosa level. The needle was placed in the peritoneum over on the lateral right sidewall and out of the way. The pelvis was irrigated no bleeding was noted. The ureters were noted with peristalsis bilaterally.  In Interceed was placed along vaginal cuff. All pedicles were hemostatic ovaries appeared normal. The midline laparoscope was removed and using a 5 mm laparoscope through the assistant port, the needle was brought out through the midline port. At this point, the laparoscopic portion of the procedure was done. I was back at the bedside. The #1 and #2 ports were removed under direct visualization. The assistants port was removed under direct visualization.  Then the the laparoscope was removed. The CRNA give the patient several deep breaths an attempt was made to get all gas out of the abdomen.  The midline port was removed. The midline was closed at the fascial level with figure-of-eight suture of #0 Vicryl. The skin was cleansed and all the incisions were closed with subcuticular stitches of 3-0 Vicryl. Dermabond was applied  across each incision.  The legs were lifted to the high lithotomy position. A Foley catheter was removed. Using a 70 cystoscope, a cystoscopy was performed. The patient had been given indigo carmine and blue dye was seen extruding from both ureteral orifices. There were no stitches in the bladder and the bladder mucosal was without any abnormal findings. The cystoscope was removed and the bladder was drained of all urine a cystoscopic fluid. The Foley catheter was left out On the patient's left mons there was a mole she wished to have removed. This was elevated with pickups and excised with a #15 blade. Figure-of-eight suture of #3-0 Vicryl was used to close incision. Hemostasis was present. Vaginal cuff was visualized no bleeding was noted. There was a clot in the vagina this was removed  At this point the procedure was ended. Sponge, laps, instruments, and needle counts were correct x2. The patient was extubated  and was in stable condition. She was and taken to the recovery room.  COUNTS:  YES  PLAN OF CARE: Transfer to PACU

## 2011-09-11 ENCOUNTER — Encounter (HOSPITAL_COMMUNITY): Payer: Self-pay | Admitting: Obstetrics & Gynecology

## 2012-10-28 ENCOUNTER — Encounter: Payer: Self-pay | Admitting: Obstetrics & Gynecology

## 2012-10-28 ENCOUNTER — Ambulatory Visit (INDEPENDENT_AMBULATORY_CARE_PROVIDER_SITE_OTHER): Payer: Managed Care, Other (non HMO) | Admitting: Obstetrics & Gynecology

## 2012-10-28 VITALS — BP 104/72 | Ht 62.5 in | Wt 189.8 lb

## 2012-10-28 DIAGNOSIS — E669 Obesity, unspecified: Secondary | ICD-10-CM

## 2012-10-28 DIAGNOSIS — N6019 Diffuse cystic mastopathy of unspecified breast: Secondary | ICD-10-CM

## 2012-10-28 MED ORDER — PHENTERMINE HCL 37.5 MG PO CAPS: 37.5000 mg | ORAL_CAPSULE | Freq: Every day | ORAL | Status: DC

## 2012-10-28 NOTE — Progress Notes (Signed)
    Subjective:   42 y.o. MarriedCaucasian female presents for recheck after starting phentermine.  Has been on it one month.  Her weight at home (which is within a pound of here) was 199.  189 today.  No HA or blurry vision.  Also reports new itching around nipple and tingling sensation behind nipple for about two months  No nipple discharge or trauma.  Contributing factors include none.  Patient denies hiistory of trauma, bites, or injuries. Never had a mmg due to age--88.  Previous evaluation has includes:  none   Review of Systems A comprehensive review of systems was negative.   Objective:  Wt: 189 BMI: 34 General appearance: alert and cooperative Head: Normocephalic, without obvious abnormality, atraumatic Neck: no adenopathy, supple, symmetrical, trachea midline and thyroid not enlarged, symmetric, no tenderness/mass/nodules Breasts: normal appearance, no masses or tenderness, No nipple retraction or dimpling, No axillary or supraclavicular adenopathy, positive findings: fibrocystic changes and these are in both breasts.  Right breast findings at 10 and left breast findings at 2 near axilla. Skin: no abnormal findings on breast skin   Assessment:   Obesity, desirous of weight loss New breast tingling sensation without any family hx of breast cancer   Plan:   Continue Phentermine 37.5mg  qd.  Rx x 2 months.  F/U then. D/W Pt breast findings.  Feels benign.  Will recheck at follow-up two months.

## 2012-10-28 NOTE — Patient Instructions (Signed)
Please call if you have any new breast changes or symptoms related to the phentermine.

## 2012-10-29 ENCOUNTER — Telehealth: Payer: Self-pay | Admitting: Obstetrics & Gynecology

## 2012-10-29 MED ORDER — PHENTERMINE HCL 37.5 MG PO CAPS: 37.5000 mg | ORAL_CAPSULE | Freq: Every day | ORAL | Status: DC

## 2012-10-29 NOTE — Telephone Encounter (Signed)
Fine.  Thank you.

## 2012-10-29 NOTE — Telephone Encounter (Signed)
PHARMACY CALLING REGARDING PRESCRIPTION THAT WAS SENT IN FOR PT. [facility] [phone].

## 2013-01-01 ENCOUNTER — Ambulatory Visit: Payer: Managed Care, Other (non HMO) | Admitting: Obstetrics & Gynecology

## 2013-01-26 ENCOUNTER — Telehealth: Payer: Self-pay | Admitting: Family Medicine

## 2013-01-26 ENCOUNTER — Ambulatory Visit (INDEPENDENT_AMBULATORY_CARE_PROVIDER_SITE_OTHER): Payer: Managed Care, Other (non HMO) | Admitting: Family Medicine

## 2013-01-26 ENCOUNTER — Encounter: Payer: Self-pay | Admitting: Family Medicine

## 2013-01-26 VITALS — BP 119/77 | HR 74 | Temp 98.6°F | Wt 190.4 lb

## 2013-01-26 DIAGNOSIS — G43109 Migraine with aura, not intractable, without status migrainosus: Secondary | ICD-10-CM

## 2013-01-26 MED ORDER — BUTALBITAL-APAP-CAFFEINE 50-325-40 MG PO TABS: 1.0000 | ORAL_TABLET | Freq: Four times a day (QID) | ORAL | Status: AC | PRN

## 2013-01-26 NOTE — Progress Notes (Signed)
  Subjective:    Patient ID: Erika [name], female    DOB: 1970-12-10, 42 y.o.   MRN: [id number]  HPI  This 42 y.o. female presents for evaluation of migraine headaches.  She was having a migraine headache yesterday and she developed some expressive aphasia And this never happened before with any headaches in the past.  She gets headaches only every couple months.  She is allergic to zomig and doesn't do well with triptans. She is not on headache prophylaxis.  She uses excedrin migraine for abortive treatment.  Review of Systems She c/o migraine headache.No chest pain, SOB, dizziness, vision change, N/V, diarrhea, constipation, dysuria, urinary urgency or frequency, myalgias, arthralgias or rash.     Objective:   Physical Exam  Vital signs noted  Well developed well nourished female.  HEENT - Head atraumatic Normocephalic                Eyes - PERRLA, Conjuctiva - clear Sclera- Clear EOMI                Ears - EAC's Wnl TM's Wnl Gross Hearing WNL                Nose - Nares patent                 Throat - oropharanx wnl Respiratory - Lungs CTA bilateral Cardiac - RRR S1 and S2 without murmur GI - Abdomen soft Nontender and bowel sounds active x 4 Extremities - No edema. Neuro - Grossly intact.      Assessment & Plan:  Complicated migraine - Plan: Ambulatory referral to Neurology, butalbital-acetaminophen-caffeine (FIORICET) 50-325-40 MG per tablet Follow up prn if headache worsens or if sx's return.  Expressive Aphasia - Discussed with patient that this developed during a migraine and is more than likely due to Migraine etiology.  Will refer To Neurology for Specialty Evaluation.

## 2013-01-26 NOTE — Patient Instructions (Addendum)
Migraine Headache A migraine headache is an intense, throbbing pain on one or both sides of your head. A migraine can last for 30 minutes to several hours. CAUSES  The exact cause of a migraine headache is not always known. However, a migraine may be caused when nerves in the brain become irritated and release chemicals that cause inflammation. This causes pain. SYMPTOMS  Pain on one or both sides of your head.  Pulsating or throbbing pain.  Severe pain that prevents daily activities.  Pain that is aggravated by any physical activity.  Nausea, vomiting, or both.  Dizziness.  Pain with exposure to bright lights, loud noises, or activity.  General sensitivity to bright lights, loud noises, or smells. Before you get a migraine, you may get warning signs that a migraine is coming (aura). An aura may include:  Seeing flashing lights.  Seeing bright spots, halos, or zig-zag lines.  Having tunnel vision or blurred vision.  Having feelings of numbness or tingling.  Having trouble talking.  Having muscle weakness. MIGRAINE TRIGGERS  Alcohol.  Smoking.  Stress.  Menstruation.  Aged cheeses.  Foods or drinks that contain nitrates, glutamate, aspartame, or tyramine.  Lack of sleep.  Chocolate.  Caffeine.  Hunger.  Physical exertion.  Fatigue.  Medicines used to treat chest pain (nitroglycerine), birth control pills, estrogen, and some blood pressure medicines. DIAGNOSIS  A migraine headache is often diagnosed based on:  Symptoms.  Physical examination.  A CT scan or MRI of your head. TREATMENT Medicines may be given for pain and nausea. Medicines can also be given to help prevent recurrent migraines.  HOME CARE INSTRUCTIONS  Only take over-the-counter or prescription medicines for pain or discomfort as directed by your caregiver. The use of long-term narcotics is not recommended.  Lie down in a dark, quiet room when you have a migraine.  Keep a journal  to find out what may trigger your migraine headaches. For example, write down:  What you eat and drink.  How much sleep you get.  Any change to your diet or medicines.  Limit alcohol consumption.  Quit smoking if you smoke.  Get 7 to 9 hours of sleep, or as recommended by your caregiver.  Limit stress.  Keep lights dim if bright lights bother you and make your migraines worse. SEEK IMMEDIATE MEDICAL CARE IF:   Your migraine becomes severe.  You have a fever.  You have a stiff neck.  You have vision loss.  You have muscular weakness or loss of muscle control.  You start losing your balance or have trouble walking.  You feel faint or pass out.  You have severe symptoms that are different from your first symptoms. MAKE SURE YOU:   Understand these instructions.  Will watch your condition.  Will get help right away if you are not doing well or get worse. Document Released: 07/08/2005 Document Revised: 09/30/2011 Document Reviewed: 06/28/2011 [employer] Patient Information 2014 [employer].  

## 2013-01-26 NOTE — Telephone Encounter (Signed)
APPT MADE

## 2013-02-12 ENCOUNTER — Encounter: Payer: Self-pay | Admitting: Neurology

## 2013-02-12 ENCOUNTER — Ambulatory Visit (INDEPENDENT_AMBULATORY_CARE_PROVIDER_SITE_OTHER): Payer: Managed Care, Other (non HMO) | Admitting: Neurology

## 2013-02-12 VITALS — BP 115/65 | HR 82 | Temp 97.7°F | Ht 63.0 in | Wt 189.0 lb

## 2013-02-12 DIAGNOSIS — G43109 Migraine with aura, not intractable, without status migrainosus: Secondary | ICD-10-CM

## 2013-02-12 HISTORY — DX: Migraine with aura, not intractable, without status migrainosus: G43.109

## 2013-02-12 MED ORDER — PROMETHAZINE HCL 12.5 MG PO TABS: 12.5000 mg | ORAL_TABLET | Freq: Two times a day (BID) | ORAL | Status: DC | PRN

## 2013-02-12 NOTE — Patient Instructions (Addendum)
I think overall you are doing fairly well and are stable at this point.   I do have some generic suggestions for you today:  Please make sure that you drink plenty of fluids. I would like for you to exercise daily for example in the form of walking 20-30 minutes every day, if you can. Please keep a regular sleep-wake schedule, keep regular meal times, do not skip any meals, eat  healthy snacks in between meals, such as fruit or nuts. Try to eat protein with every meal.   As far as your medications are concerned, I would like to suggest: as needed use of fioricet and phenergan, be aware of sedation.   As far as diagnostic testing, I recommend: MRI brain and bubble study to look for a PFO (patent foramen ovale)  I do not think we need to make any changes in your medications at this point. I think you're stable enough that I can see you back in 6 months, sooner if we need to. Please call us if you have any interim questions, concerns, or problems or updates to need to discuss.  I think you are stable enough for me to see you on an as needed basis. Our phone number is [phone]. We also have an after hours call service for urgent matters and there is a physician on-call for urgent questions. For any emergencies you know to call 911 or go to the nearest emergency room.

## 2013-02-12 NOTE — Progress Notes (Signed)
Subjective:    Erika [name] ID: Erika Erika [name] is a 42 y.o. female.  HPI  Erika Foley, MD, PhD [facility] [address], [address] [address] [place], [place] [address]  Dear Erika Erika [name],   I saw your Erika [name], Erika Erika [name], upon your kind request in my neurologic clinic today for initial consultation of her migraine headaches. Erika Erika [name] is unaccompanied today. As you know, Erika Erika [name] is a very pleasant 42 year old right handed woman with a benign underlying medical history, who has been experiencing migraine headaches for Erika past few years. Recently she had an episode of migraine associated with expressive dysphasia as well as lip numbness on Erika right side. this was Erika first time she had neurologic focal symptoms and they lasted for 30-40 minutes, but Erika lip numbness lasted for almost a week. She has not been able to tolerate triptans in Erika past - she had severe paresthesias. She was given a prescription for Fioricet, but she has not taken it yet.  She snores, but not loudly and no apneas are reported. She feels at baseline. She had a total of 2 migraines with neurological manifestation, one in 5/14 and 6/14. She had a hysterectomy.   She reports a midline and occipital headache, which is described as intermittent and as a throbbing and stabbing sensation Her headache (HA) frequency is infrequent and in Erika last 3 months, she had those 2 migraines. She has a FHx of migraine in her mother and no stroke in Erika Family. She has never had an MRI. There is associated nausea and no vomiting, and there is photophobia, and not so much sonophobia. She usually has a visual aura. She denies any TIA-like symptoms other than in Erika context of her headaches. She has never had any macular vision loss.  Her Past Medical History Is Significant For: Past Medical History  Diagnosis Date  . Headache(784.0)     history, otc med prn, last one 2 yrs ago  . Abnormal uterine bleeding     Her Past  Surgical History Is Significant For: Past Surgical History  Procedure Laterality Date  . Dilation and curettage of uterus    . Cesarean section      x 2  . Wisdom tooth extraction    . Tubal ligation    . Endometrial ablation      failed - done in MD office  . Cystoscopy  09/10/2011    Procedure: CYSTOSCOPY;  Surgeon: Lum Keas, MD;  Location: [facility];  Service: Gynecology;  Laterality: N/A;  . Laparoscopic total hysterectomy      robotic    Her Family History Is Significant For: Family History  Problem Relation Age of Onset  . Diabetes Maternal Grandfather   . Diabetes Paternal Grandmother   . Diabetes Paternal Grandfather     Her Social History Is Significant For: History   Social History  . Marital Status: Married    Spouse Name: N/A    Number of Children: N/A  . Years of Education: N/A   Social History Main Topics  . Smoking status: Never Smoker   . Smokeless tobacco: Never Used  . Alcohol Use: Yes     Comment: less than 1 per week  . Drug Use: No  . Sexually Active: Yes -- Female partner(s)    Birth Control/ Protection: Surgical     Comment: robotic TLH   Other Topics Concern  . None   Social History Narrative  . None  Her Allergies Are:  Allergies  Allergen Reactions  . Penicillins Rash  . Zomig (Zolmitriptan) Rash  :   Her Current Medications Are:  Outpatient Encounter Prescriptions as of 02/12/2013  Medication Sig Dispense Refill  . Aspirin-Acetaminophen-Caffeine (EXCEDRIN MIGRAINE PO) Take 1 tablet by mouth as needed.      . butalbital-acetaminophen-caffeine (FIORICET) 50-325-40 MG per tablet Take 1-2 tablets by mouth every 6 (six) hours as needed for headache.  20 tablet  0  . ibuprofen (ADVIL,MOTRIN) 200 MG tablet Take 400 mg by mouth every 6 (six) hours as needed.      . phentermine 37.5 MG capsule Take 1 capsule (37.5 mg total) by mouth daily.  30 capsule  1   No facility-administered encounter medications on file as of 02/12/2013.   : Review of Systems  Eyes: Positive for visual disturbance.  Neurological: Positive for speech difficulty, numbness and headaches.       Memory loss     Objective:  Neurologic Exam  Physical Exam Physical Examination:   Filed Vitals:   02/12/13 0828  BP: 115/65  Pulse: 82  Temp: 97.7 F (36.5 C)    General Examination: Erika Erika [name] is a very pleasant 42 y.o. female in no acute distress. She appears well-developed and well-nourished and well groomed.   HEENT: Normocephalic, atraumatic, pupils are equal, round and reactive to light and accommodation. Funduscopic exam is normal with sharp disc margins noted. Extraocular tracking is good without limitation to gaze excursion or nystagmus noted. Normal smooth pursuit is noted. Hearing is grossly intact. Tympanic membranes are clear bilaterally. Face is symmetric with normal facial animation and normal facial sensation. Speech is clear with no dysarthria noted. There is no hypophonia. There is no lip, neck/head, jaw or voice tremor. Neck is supple with full range of passive and active motion. There are no carotid bruits on auscultation. Oropharynx exam reveals: mild mouth dryness. Tongue protrudes centrally and palate elevates symmetrically.    Chest: Clear to auscultation without wheezing, rhonchi or crackles noted.  Heart: S1+S2+0, regular and normal without murmurs, rubs or gallops noted.   Abdomen: Soft, non-tender and non-distended with normal bowel sounds appreciated on auscultation.  Extremities: There is no pitting edema in Erika distal lower extremities bilaterally. Pedal pulses are intact.  Skin: Warm and dry without trophic changes noted. There are no varicose veins.  Musculoskeletal: exam reveals no obvious joint deformities, tenderness or joint swelling or erythema.   Neurologically:  Mental status: Erika Erika [name] is awake, alert and oriented in all 4 spheres. Her memory, attention, language and knowledge are appropriate.  There is no aphasia, agnosia, apraxia or anomia. Speech is clear with normal prosody and enunciation. Thought process is linear. Mood is congruent and affect is normal.  Cranial nerves are as described above under HEENT exam. In addition, shoulder shrug is normal with equal shoulder height noted. Motor exam: Normal bulk, strength and tone is noted. There is no drift, tremor or rebound. Romberg is negative. Reflexes are 2+ throughout. Toes are downgoing bilaterally. Fine motor skills are intact with normal finger taps, normal hand movements, normal rapid alternating patting, normal foot taps and normal foot agility.  Cerebellar testing shows no dysmetria or intention tremor on finger to nose testing. Heel to shin is unremarkable bilaterally. There is no truncal or gait ataxia.  Sensory exam is intact to light touch, pinprick, vibration, temperature sense and proprioception in Erika upper and lower extremities.  Gait, station and balance are unremarkable. No veering to one side  is noted. No leaning to one side is noted. Posture is age-appropriate and stance is narrow based. No problems turning are noted. She turns en bloc. Tandem walk is unremarkable. Intact toe and heel stance is noted.               Assessment:   Assessment and Plan:  In summary, Erika Erika [name] is a very pleasant 42 y.o.-year old female with a history of migraine w aura with recent history of complicated migraine. Her physical exam is stable and non-focal and I reassured Erika Erika [name] in that regard. However, since she had a recent change in her headache pattern I would like to proceed with an MRI brain without contrast as well as look for any evidence of PFO. We can do a bubble study with transcranial Doppler and emboli monitoring for this. She's agreeable to pursuing further testing. Treatment wise we talked about preventative and abortive medications. Since she continues to have in frequent migraines she can continue with Urised as needed is  provided by you and also with Phenergan as needed. If she has more frequent headaches we will have to talk about preventative medications that she will have to take regularly. At this juncture since she is doing fairly well and is back to baseline and has infrequent headaches I can see her back on an as-needed basis and we can call her with her test results. She was in agreement. If she has any further problems or recurrent issues with migraines I would be happy to see her back.  Thank you very much for allowing me to participate in Erika care of this nice Erika [name]. If I can be of any further assistance to you please do not hesitate to call me at [phone].  Sincerely,   Erika Foley, MD, PhD

## 2013-02-25 ENCOUNTER — Ambulatory Visit (INDEPENDENT_AMBULATORY_CARE_PROVIDER_SITE_OTHER): Payer: Managed Care, Other (non HMO)

## 2013-02-25 DIAGNOSIS — G43109 Migraine with aura, not intractable, without status migrainosus: Secondary | ICD-10-CM

## 2013-03-04 ENCOUNTER — Telehealth: Payer: Self-pay | Admitting: Neurology

## 2013-03-05 ENCOUNTER — Telehealth: Payer: Self-pay | Admitting: Neurology

## 2013-03-05 ENCOUNTER — Other Ambulatory Visit: Payer: Managed Care, Other (non HMO)

## 2013-03-05 ENCOUNTER — Ambulatory Visit: Payer: Managed Care, Other (non HMO) | Admitting: Neurology

## 2013-03-05 NOTE — Telephone Encounter (Signed)
Returned call. No answer.  

## 2013-03-05 NOTE — Telephone Encounter (Signed)
Called patient and let her know her  MRI results are normal.

## 2013-03-10 ENCOUNTER — Ambulatory Visit (INDEPENDENT_AMBULATORY_CARE_PROVIDER_SITE_OTHER): Payer: Managed Care, Other (non HMO) | Admitting: Neurology

## 2013-03-10 ENCOUNTER — Ambulatory Visit (INDEPENDENT_AMBULATORY_CARE_PROVIDER_SITE_OTHER): Payer: Managed Care, Other (non HMO)

## 2013-03-10 ENCOUNTER — Encounter: Payer: Self-pay | Admitting: Neurology

## 2013-03-10 VITALS — BP 113/76 | HR 70 | Ht 63.0 in | Wt 189.0 lb

## 2013-03-10 DIAGNOSIS — G43109 Migraine with aura, not intractable, without status migrainosus: Secondary | ICD-10-CM

## 2013-03-10 DIAGNOSIS — G9389 Other specified disorders of brain: Secondary | ICD-10-CM

## 2013-03-10 DIAGNOSIS — R4701 Aphasia: Secondary | ICD-10-CM

## 2013-03-10 DIAGNOSIS — Z0289 Encounter for other administrative examinations: Secondary | ICD-10-CM

## 2013-03-10 NOTE — Patient Instructions (Signed)
Continue followups with Dr. Frances Furbish for her migraines. No followup necessary with me.

## 2013-03-10 NOTE — Progress Notes (Addendum)
Subjective:    Patient ID: Erika [name] is a 42 y.o. female. Referring MD : Huston Foley, MD Reason ; migraine with visual and neurological symptoms and evaluate for PFO HPI   Erika [name] is a very pleasant 42 year old right handed woman with a benign underlying medical history, who has been experiencing migraine headaches for the past few years. Recently she had an episode of migraine associated with expressive dysphasia as well as lip numbness on the right side. this was the first time she had neurologic focal symptoms and they lasted for 30-40 minutes, but the lip numbness lasted for almost a week. She has not been able to tolerate triptans in the past - she had severe paresthesias. She was given a prescription for Fioricet, but she has not taken it yet.    She had a total of 2 migraines with neurological manifestation, one in 5/14 and 6/14. She had a hysterectomy. . She denies any TIA-like symptoms other than in the context of her headaches. She has never had any macular vision loss. 03/10/2013 she reports no further significant headache since last visit with Dr. Frances Furbish. She used to have visual migraines the past but has not had any recent ones. Her Past Medical History Is Significant For: Past Medical History  Diagnosis Date  . Headache(784.0)     history, otc med prn, last one 2 yrs ago  . Abnormal uterine bleeding   . Migraine with aura 02/12/2013  . Complicated migraine 02/12/2013    Her Past Surgical History Is Significant For: Past Surgical History  Procedure Laterality Date  . Dilation and curettage of uterus    . Cesarean section      x 2  . Wisdom tooth extraction    . Tubal ligation    . Endometrial ablation      failed - done in MD office  . Cystoscopy  09/10/2011    Procedure: CYSTOSCOPY;  Surgeon: Lum Keas, MD;  Location: [facility];  Service: Gynecology;  Laterality: N/A;  . Laparoscopic total hysterectomy      robotic    Her Family History Is Significant  For: Family History  Problem Relation Age of Onset  . Diabetes Maternal Grandfather   . Diabetes Paternal Grandmother   . Diabetes Paternal Grandfather     Her Social History Is Significant For: History   Social History  . Marital Status: Married    Spouse Name: N/A    Number of Children: N/A  . Years of Education: N/A   Social History Main Topics  . Smoking status: Never Smoker   . Smokeless tobacco: Never Used  . Alcohol Use: Yes     Comment: less than 1 per week  . Drug Use: No  . Sexual Activity: Yes    Partners: Male    Birth Control/ Protection: Surgical     Comment: robotic TLH   Other Topics Concern  . None   Social History Narrative  . None    Her Allergies Are:  Allergies  Allergen Reactions  . Penicillins Rash  . Zomig [Zolmitriptan] Rash  :   Her Current Medications Are:  Outpatient Encounter Prescriptions as of 03/10/2013  Medication Sig Dispense Refill  . Aspirin-Acetaminophen-Caffeine (EXCEDRIN MIGRAINE PO) Take 1 tablet by mouth as needed.      . butalbital-acetaminophen-caffeine (FIORICET) 50-325-40 MG per tablet Take 1-2 tablets by mouth every 6 (six) hours as needed for headache.  20 tablet  0  . ibuprofen (ADVIL,MOTRIN) 200 MG tablet  Take 400 mg by mouth every 6 (six) hours as needed.      . phentermine 37.5 MG capsule Take 1 capsule (37.5 mg total) by mouth daily.  30 capsule  1  . promethazine (PHENERGAN) 12.5 MG tablet Take 1 tablet (12.5 mg total) by mouth every 12 (twelve) hours as needed for nausea (associated with Migraine).  30 tablet  1   No facility-administered encounter medications on file as of 03/10/2013.  : Review of Systems  Eyes: Positive for visual disturbance.  Neurological: Positive for speech difficulty, numbness and headaches.       Memory loss     Objective:  Neurologic Exam  Physical Exam Physical Examination:   Filed Vitals:   03/10/13 1342  BP: 113/76  Pulse: 70    General Examination: The patient is a  very pleasant 42 y.o. female in no acute distress. She appears well-developed and well-nourished and well groomed.    .   Neurologically:  Mental status: The patient is awake, alert and oriented Her memory, attention, language and knowledge are appropriate. There is no aphasia, agnosia, apraxia or anomia. Speech is clear   Cranial nerves are normal.Motor exam: Normal bulk, strength and tone is noted. There is no drift, tremor or rebound. Romberg is negative. Reflexes are 2+ throughout. Toes are downgoing bilaterally. Fine motor skills are intact with normal finger taps, normal hand movements, normal rapid alternating patting, normal foot taps and normal foot agility.  Cerebellar testing shows no dysmetria or intention tremor on finger to nose testing. Heel to shin is unremarkable bilaterally. There is no truncal or gait ataxia.  Sensory exam is intact to light touch, pinprick, vibration, temperature sense and proprioception in the upper and lower extremities.                  Assessment:   Assessment and Plan:     DEVANSHI [name] is a very pleasant 42 y.o.-year old female with a history of migraine w aura with recent history of complicated migraine. Her physical exam and MRI brain are unremarkable . Transcranial doppler bubble  study would be appropriate to look for patent foreman ovale.       [facility]      [address]      [place]. [place] [address] [phone]       TRANSCRANIAL DOPPLER BUBBLE STUDY   Erika [name] Date of Birth:  Jun 10, 1971 Medical Record Number:  [id number]   Indications: Diagnostic Date of Procedure: 03/10/2013 Clinical History:   65 year lady with headaches and visual and neurologic disturbance Technical Description:   Transcranial Doppler Bubble Study was performed at the bedside after taking written informed consent from the patient and explaining risk/benefits. Both middle cerebral arteries were insonated using a headset. And IV line was  inserted in the left forearm by the RN using aseptic precautions. Agitated saline injection at rest and after valsalva maneuver did  result in only a few high intensity transient signals (HITS).   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   Results were explained to the patient. Questions were answered. Such a trivial small shunt is unlikely to be of clinical significance and does not merit further evaluation or treatment considerations. I advised her to follow up with Dr. Frances Furbish for her migraines and no followup was made with me  Delia Heady, MD

## 2013-03-11 ENCOUNTER — Ambulatory Visit: Payer: Self-pay | Admitting: Obstetrics & Gynecology

## 2013-04-08 ENCOUNTER — Encounter: Payer: Self-pay | Admitting: Obstetrics & Gynecology

## 2013-05-07 ENCOUNTER — Ambulatory Visit (INDEPENDENT_AMBULATORY_CARE_PROVIDER_SITE_OTHER): Payer: Managed Care, Other (non HMO) | Admitting: Obstetrics & Gynecology

## 2013-05-07 ENCOUNTER — Encounter: Payer: Self-pay | Admitting: Obstetrics & Gynecology

## 2013-05-07 VITALS — BP 100/62 | HR 74 | Resp 18 | Ht 62.0 in | Wt 194.0 lb

## 2013-05-07 DIAGNOSIS — Z0000 Encounter for general adult medical examination without abnormal findings: Secondary | ICD-10-CM

## 2013-05-07 DIAGNOSIS — Z01419 Encounter for gynecological examination (general) (routine) without abnormal findings: Secondary | ICD-10-CM

## 2013-05-07 LAB — POCT URINALYSIS DIPSTICK
Ketones, UA: NEGATIVE
Leukocytes, UA: NEGATIVE
Protein, UA: NEGATIVE
Urobilinogen, UA: NEGATIVE

## 2013-05-07 NOTE — Progress Notes (Signed)
42 y.o. G3P2 MarriedCaucasianF here for annual exam.  No vaginal bleeding.  Daughters doing really well.  Youngest graduating from high school early--in January.  Going to [place] in January.  This is her 8th birthday gift.    Tried phentermine.  Stopped it because she was having some atypical neurologic testing.  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  Patient's last menstrual period was 08/20/2011.          Sexually active: yes  The current method of family planning is tubal ligation and status post hysterectomy.    Exercising: yes  walking 3 days a week for 30 min Smoker:  no   Health Maintenance: Pap: 02/07/11 neg History of abnormal Pap:  no MMG: never Colonoscopy: 06/2012 normal, repeat at age 47 TDaP : 12/2000 BMD:   never  Screening Labs: next year, Hb today: PCP Urine today: neg   reports that she has never smoked. She has never used smokeless tobacco. She reports that she drinks about 0.5 ounces of alcohol per week. She reports that she does not use illicit drugs.  Past Medical History  Diagnosis Date  . Headache(784.0)     history, otc med prn, last one 2 yrs ago  . Abnormal uterine bleeding   . Migraine with aura 02/12/2013  . Complicated migraine 02/12/2013    Past Surgical History  Procedure Laterality Date  . Dilation and curettage of uterus    . Cesarean section      x 2  . Wisdom tooth extraction    . Tubal ligation    . Endometrial ablation      failed - done in MD office  . Cystoscopy  09/10/2011    Procedure: CYSTOSCOPY;  Surgeon: Lum Keas, MD;  Location: [facility];  Service: Gynecology;  Laterality: N/A;  . Laparoscopic total hysterectomy      robotic    Current Outpatient Prescriptions  Medication Sig Dispense Refill  . Aspirin-Acetaminophen-Caffeine (EXCEDRIN MIGRAINE PO) Take 1 tablet by mouth as needed.      .  butalbital-acetaminophen-caffeine (FIORICET) 50-325-40 MG per tablet Take 1-2 tablets by mouth every 6 (six) hours as needed for headache.  20 tablet  0  . ibuprofen (ADVIL,MOTRIN) 200 MG tablet Take 400 mg by mouth every 6 (six) hours as needed.      . promethazine (PHENERGAN) 12.5 MG tablet Take 1 tablet (12.5 mg total) by mouth every 12 (twelve) hours as needed for nausea (associated with Migraine).  30 tablet  1   No current facility-administered medications for this visit.    Family History  Problem Relation Age of Onset  . Diabetes Maternal Grandfather   . Diabetes Paternal Grandmother   . Diabetes Paternal Grandfather     ROS:  Pertinent items are noted in HPI.  Otherwise, a comprehensive ROS was negative.  Exam:   BP 100/62  Pulse 74  Resp 18  Ht 5\' 2"  (1.575 m)  Wt 194 lb (87.998 kg)  BMI 35.47 kg/m2  LMP 08/20/2011  Height: 5\' 2"  (157.5 cm)  Ht Readings from Last 3 Encounters:  05/07/13 5\' 2"  (1.575 m)  03/10/13 5\' 3"  (1.6 m)  02/12/13 5\' 3"  (1.6 m)    General appearance: alert, cooperative and appears stated age Head: Normocephalic, without obvious abnormality, atraumatic Neck: no adenopathy, supple, symmetrical, trachea midline and thyroid normal to inspection and palpation Lungs: clear to auscultation  bilaterally Breasts: normal appearance, no masses or tenderness Heart: regular rate and rhythm Abdomen: soft, non-tender; bowel sounds normal; no masses,  no organomegaly Extremities: extremities normal, atraumatic, no cyanosis or edema Skin: Skin color, texture, turgor normal. No rashes or lesions Lymph nodes: Cervical, supraclavicular, and axillary nodes normal. No abnormal inguinal nodes palpated Neurologic: Grossly normal   Pelvic: External genitalia:  no lesions              Urethra:  normal appearing urethra with no masses, tenderness or lesions              Bartholins and Skenes: normal                 Vagina: normal appearing vagina with normal color  and discharge, no lesions              Cervix: absent              Pap taken: no Bimanual Exam:  Uterus:  uterus absent              Adnexa: normal adnexa and no mass, fullness, tenderness               Rectovaginal: Confirms               Anus:  normal sphincter tone, no lesions  A:  Well Woman with normal exam Migraines Atypical neurologic symptoms with evaluation that was essentially negative H/O TLH/bilateral salpingectomy  P:   Mammogram starting at age 50 Colonoscopy 2013.  F/U 10 years.  pap smear not indicated. Blood work planned for next year. return annually or prn  An After Visit Summary was printed and given to the patient.

## 2013-05-07 NOTE — Patient Instructions (Signed)

## 2013-05-27 ENCOUNTER — Other Ambulatory Visit: Payer: Self-pay

## 2013-06-24 ENCOUNTER — Other Ambulatory Visit: Payer: Self-pay

## 2013-06-24 DIAGNOSIS — Z1231 Encounter for screening mammogram for malignant neoplasm of breast: Secondary | ICD-10-CM

## 2013-08-03 ENCOUNTER — Ambulatory Visit
Admission: RE | Admit: 2013-08-03 | Discharge: 2013-08-03 | Disposition: A | Discharge status: Home / Self Care | Payer: Managed Care, Other (non HMO) | Source: Ambulatory Visit

## 2013-08-03 DIAGNOSIS — Z1231 Encounter for screening mammogram for malignant neoplasm of breast: Secondary | ICD-10-CM

## 2013-11-29 ENCOUNTER — Telehealth: Payer: Self-pay | Admitting: Neurology

## 2013-11-29 NOTE — Telephone Encounter (Signed)
Patient calling for an appointment for new symptoms of ringing in her ears, tremors, numbness in her hands and feet and at times feels lightheaded. These symptoms have been ongoing for x1 month. Please call to advise.

## 2013-12-01 NOTE — Telephone Encounter (Signed)
I would agree, that it may be best to see PCP first. Symptoms are vague and may be due to a number of things.

## 2013-12-01 NOTE — Telephone Encounter (Signed)
Called pt and pt stated that she is having new symptoms and I asked pt if she has seen her PCP and pt stated that she has not. I advised the pt to see her PCP about these new symptoms of tremors in her hands and numbness in her hands also and if she needs [place] to call us back. FYI

## 2013-12-07 ENCOUNTER — Encounter: Payer: Self-pay | Admitting: Nurse Practitioner

## 2013-12-07 ENCOUNTER — Telehealth: Payer: Self-pay | Admitting: Obstetrics & Gynecology

## 2013-12-07 ENCOUNTER — Ambulatory Visit (INDEPENDENT_AMBULATORY_CARE_PROVIDER_SITE_OTHER): Payer: Managed Care, Other (non HMO) | Admitting: Nurse Practitioner

## 2013-12-07 VITALS — BP 94/62 | HR 68 | Ht 62.0 in | Wt 196.0 lb

## 2013-12-07 DIAGNOSIS — N6489 Other specified disorders of breast: Secondary | ICD-10-CM

## 2013-12-07 NOTE — Progress Notes (Signed)
Subjective:     Patient ID: Erika [name], female   DOB: 31-Jan-1971, 43 y.o.   MRN: [id number]  HPI  tis 43 yo WM Fe complains of right breast bruising noted this am when ready to shower.  No history of trauma. She did lift a plastic pool ladder to the pool on Monday.  Small dog also sleeps with her and walks around in the bed and on top of her.  Post hysterectomy 2013.  Takes NSAIDs for HA's but not any in past 30 days. No redness, warmth, nipple discharge.  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  Last Mammo 07/2013. Area is non painful, no nipple discharge, no warmth.   Review of Systems  Constitutional: Negative for fever, chills and fatigue.  Respiratory: Negative.   Cardiovascular: Negative.   Gastrointestinal: Negative.   Genitourinary: Negative.   Musculoskeletal: Negative.   Skin: Positive for color change.  Neurological: Negative.   Psychiatric/Behavioral: Negative.        Objective:   Physical Exam  Constitutional: She appears well-developed and well-nourished. No distress.  Pulmonary/Chest: Effort normal.  Abdominal: Soft. There is no tenderness.    No rashes  Neurological: She is alert.  Skin: Skin is warm and dry.  Bruise right breast that is 43 days old - area is purple to green around the borders.  Area is 1 X 2.5 cm size.  There is small blood vessels that run over this area of the breast bilaterally. No other bruises, rash, or signs of a bite. No axillary nodes, no nipple discharge.       Assessment:     Superficial bruise right breast without a hematoma and no history of trauma     Plan:     Watchful waiting and area should improve without intervention 0 if persist or worsens to call back.

## 2013-12-07 NOTE — Telephone Encounter (Signed)
Spoke with patient. She states that she woke up this morning and noticed bruising on her left breast. No trauma or injury to the area. Unsure how she would have bruising. Requests appointment today, first available for evaluation. Scheduled office visit with Milford Cage, FNP for today at 1:45. Patient is agreeable. No prior breast surgies. Mammogram current 07/2013.   Routing to provider for final review. Patient agreeable to disposition. Will close encounter. Okay to see NP per Lamont Snowball, RN.    cc Dr. Sabra Heck.

## 2013-12-07 NOTE — Telephone Encounter (Signed)
Patient found a bruise on her right breast "about a quarter in size" when she woke up this morning. She says it is not painful but she is concerned and requests Dr. Sabra Heck see her.

## 2013-12-07 NOTE — Patient Instructions (Signed)
watchful waiting  - if fcontinues to be red, painful, or does not go away to call back

## 2013-12-13 NOTE — Progress Notes (Signed)
Encounter reviewed by Dr. Jerie Basford Silva.  

## 2014-01-05 ENCOUNTER — Ambulatory Visit (INDEPENDENT_AMBULATORY_CARE_PROVIDER_SITE_OTHER): Payer: Managed Care, Other (non HMO) | Admitting: Family

## 2014-01-05 ENCOUNTER — Encounter: Payer: Self-pay | Admitting: Family

## 2014-01-05 VITALS — BP 125/65 | HR 74 | Temp 97.7°F | Ht 62.5 in | Wt 198.2 lb

## 2014-01-05 DIAGNOSIS — R200 Anesthesia of skin: Secondary | ICD-10-CM

## 2014-01-05 DIAGNOSIS — R209 Unspecified disturbances of skin sensation: Secondary | ICD-10-CM

## 2014-01-05 DIAGNOSIS — R259 Unspecified abnormal involuntary movements: Secondary | ICD-10-CM

## 2014-01-05 DIAGNOSIS — R42 Dizziness and giddiness: Secondary | ICD-10-CM

## 2014-01-05 DIAGNOSIS — R251 Tremor, unspecified: Secondary | ICD-10-CM

## 2014-01-05 NOTE — Patient Instructions (Signed)
Tremor Tremor is a rhythmic, involuntary muscular contraction characterized by oscillations (to-and-fro movements) of a part of the body. The most common of all involuntary movements, tremor can affect various body parts such as the hands, head, facial structures, vocal cords, trunk, and legs; most tremors, however, occur in the hands. Tremor often accompanies neurological disorders associated with aging. Although the disorder is not life-threatening, it can be responsible for functional disability and social embarrassment. TREATMENT  There are many types of tremor and several ways in which tremor is classified. The most common classification is by behavioral context or position. There are five categories of tremor within this classification: resting, postural, kinetic, task-specific, and psychogenic. Resting or static tremor occurs when the muscle is at rest, for example when the hands are lying on the lap. This type of tremor is often seen in patients with Parkinson's disease. Postural tremor occurs when a patient attempts to maintain posture, such as holding the hands outstretched. Postural tremors include physiological tremor, essential tremor, tremor with basal ganglia disease (also seen in patients with Parkinson's disease), cerebellar postural tremor, tremor with peripheral neuropathy, post-traumatic tremor, and alcoholic tremor. Kinetic or intention (action) tremor occurs during purposeful movement, for example during finger-to-nose testing. Task-specific tremor appears when performing goal-oriented tasks such as handwriting, speaking, or standing. This group consists of primary writing tremor, vocal tremor, and orthostatic tremor. Psychogenic tremor occurs in both older and younger patients. The key feature of this tremor is that it dramatically lessens or disappears when the patient is distracted. PROGNOSIS There are some treatment options available for tremor; the appropriate treatment depends on  accurate diagnosis of the cause. Some tremors respond to treatment of the underlying condition, for example in some cases of psychogenic tremor treating the patient's underlying mental problem may cause the tremor to disappear. Also, patients with tremor due to Parkinson's disease may be treated with Levodopa drug therapy. Symptomatic drug therapy is available for several other tremors as well. For those cases of tremor in which there is no effective drug treatment, physical measures such as teaching the patient to brace the affected limb during the tremor are sometimes useful. Surgical intervention such as thalamotomy or deep brain stimulation may be useful in certain cases. Document Released: 06/28/2002 Document Revised: 09/30/2011 Document Reviewed: 07/08/2005 [facility] Patient Information 2015 [place], [place]. This information is not intended to replace advice given to you by your health care Keesha Pellum. Make sure you discuss any questions you have with your health care Lattie Riege. Dizziness Dizziness is a common problem. It is a feeling of unsteadiness or light-headedness. You may feel like you are about to faint. Dizziness can lead to injury if you stumble or fall. A person of any age group can suffer from dizziness, but dizziness is more common in older adults. CAUSES  Dizziness can be caused by many different things, including:  Middle ear problems.  Standing for too long.  Infections.  An allergic reaction.  Aging.  An emotional response to something, such as the sight of blood.  Side effects of medicines.  Tiredness.  Problems with circulation or blood pressure.  Excessive use of alcohol or medicines, or illegal drug use.  Breathing too fast (hyperventilation).  An irregular heart rhythm (arrhythmia).  A low red blood cell count (anemia).  Pregnancy.  Vomiting, diarrhea, fever, or other illnesses that cause body fluid loss (dehydration).  Diseases or conditions such as  Parkinson's disease, high blood pressure (hypertension), diabetes, and thyroid problems.  Exposure to extreme heat. DIAGNOSIS  Your health care Cheree Fowles will ask about your symptoms, perform a physical exam, and perform an electrocardiogram (ECG) to record the electrical activity of your heart. Your health care Domnique Vanegas may also perform other heart or blood tests to determine the cause of your dizziness. These may include:  Transthoracic echocardiogram (TTE). During echocardiography, sound waves are used to evaluate how blood flows through your heart.  Transesophageal echocardiogram (TEE).  Cardiac monitoring. This allows your health care Jyles Sontag to monitor your heart rate and rhythm in real time.  Holter monitor. This is a portable device that records your heartbeat and can help diagnose heart arrhythmias. It allows your health care Laylee Schooley to track your heart activity for several days if needed.  Stress tests by exercise or by giving medicine that makes the heart beat faster. TREATMENT  Treatment of dizziness depends on the cause of your symptoms and can vary greatly. HOME CARE INSTRUCTIONS   Drink enough fluids to keep your urine clear or pale yellow. This is especially important in very hot weather. In older adults, it is also important in cold weather.  Take your medicine exactly as directed if your dizziness is caused by medicines. When taking blood pressure medicines, it is especially important to get up slowly.  Rise slowly from chairs and steady yourself until you feel okay.  In the morning, first sit up on the side of the bed. When you feel okay, stand slowly while holding onto something until you know your balance is fine.  Move your legs often if you need to stand in one place for a long time. Tighten and relax your muscles in your legs while standing.  Have someone stay with you for 1-2 days if dizziness continues to be a problem. Do this until you feel you are well enough  to stay alone. Have the person call your health care Ellaina Schuler if he or she notices changes in you that are concerning.  Do not drive or use heavy machinery if you feel dizzy.  Do not drink alcohol. SEEK IMMEDIATE MEDICAL CARE IF:   Your dizziness or light-headedness gets worse.  You feel nauseous or vomit.  You have problems talking, walking, or using your arms, hands, or legs.  You feel weak.  You are not thinking clearly or you have trouble forming sentences. It may take a friend or family member to notice this.  You have chest pain, abdominal pain, shortness of breath, or sweating.  Your vision changes.  You notice any bleeding.  You have side effects from medicine that seems to be getting worse rather than better. MAKE SURE YOU:   Understand these instructions.  Will watch your condition.  Will get help right away if you are not doing well or get worse. Document Released: 01/01/2001 Document Revised: 07/13/2013 Document Reviewed: 01/25/2011 [facility] Patient Information 2015 [place], [place]. This information is not intended to replace advice given to you by your health care Vitalia Stough. Make sure you discuss any questions you have with your health care Ajiah Mcglinn.

## 2014-01-05 NOTE — Progress Notes (Signed)
   Subjective:    Patient ID: Erika [name], female    DOB: [birth year], 43 y.o.   MRN: [id number]  HPI Pt presents to the office with a complaints of numbess in her joints that started in March. PT states she has had the numbness in her elbows, knees, and hands. Pt states she also has had tremors in her hands over the last two months. States she has had dizziness with a fall over the last month.  Pt denies any pain, SOB, or swelling in her joints. PT states heat/working out side makes the numbness worse. Pt denies taking anything for the numbness or tremors.  *Pt saw neurology last May and had MRI.  Pt states she had tingling and numbness in her legs and had temporary memory loss and a headache. Pt was told she was having complex migraines.    Review of Systems  Constitutional: Negative.   HENT: Negative.   Respiratory: Negative.   Cardiovascular: Negative.   Gastrointestinal: Negative.   Endocrine: Positive for heat intolerance.  Genitourinary: Negative.   Skin: Negative.   Neurological: Positive for dizziness, tremors and numbness.  All other systems reviewed and are negative.      Objective:   Physical Exam  Vitals reviewed. Constitutional: She is oriented to person, place, and time. She appears well-developed and well-nourished. No distress.  HENT:  Head: Normocephalic and atraumatic.  Right Ear: External ear normal.  Left Ear: External ear normal.  Mouth/Throat: Oropharynx is clear and moist.  Eyes: Pupils are equal, round, and reactive to light.  Neck: Normal range of motion. Neck supple. No thyromegaly present.  Cardiovascular: Normal rate, regular rhythm, normal heart sounds and intact distal pulses.   No murmur heard. Pulmonary/Chest: Effort normal and breath sounds normal. No respiratory distress. She has no wheezes.  Abdominal: Soft. Bowel sounds are normal. She exhibits no distension. There is no tenderness.  Musculoskeletal: Normal range of motion. She exhibits no  edema and no tenderness.  Neurological: She is alert and oriented to person, place, and time. She has normal reflexes. No cranial nerve deficit.  Skin: Skin is warm and dry.  Psychiatric: She has a normal mood and affect. Her behavior is normal. Judgment and thought content normal.    BP 125/65  Pulse 74  Temp(Src) 97.7 F (36.5 C) (Oral)  Ht 5' 2.5" (1.588 m)  Wt 198 lb 3.2 oz (89.903 kg)  BMI 35.65 kg/m2  LMP 08/20/2011       Assessment & Plan:  1. Numbness - Ambulatory referral to Neurology - Arthritis Panel  2. Dizziness -Falls precaution discussed - Ambulatory referral to Neurology - POCT CBC - CMP14+EGFR - Thyroid Panel With TSH  3. Tremor -Avoid caffeine - Ambulatory referral to Neurology - POCT CBC - Arthritis Panel  Evelina Dun, FNP

## 2014-01-05 NOTE — Addendum Note (Signed)
Addended by: Pollyann Kennedy F on: 01/05/2014 11:07 AM   Modules accepted: Orders

## 2014-01-31 ENCOUNTER — Telehealth: Payer: Self-pay | Admitting: Neurology

## 2014-01-31 ENCOUNTER — Encounter: Payer: Self-pay | Admitting: Neurology

## 2014-01-31 ENCOUNTER — Ambulatory Visit (INDEPENDENT_AMBULATORY_CARE_PROVIDER_SITE_OTHER): Payer: Managed Care, Other (non HMO) | Admitting: Neurology

## 2014-01-31 VITALS — BP 112/71 | HR 75 | Temp 97.7°F | Ht 62.5 in | Wt 199.0 lb

## 2014-01-31 DIAGNOSIS — R259 Unspecified abnormal involuntary movements: Secondary | ICD-10-CM

## 2014-01-31 DIAGNOSIS — R251 Tremor, unspecified: Secondary | ICD-10-CM

## 2014-01-31 DIAGNOSIS — R42 Dizziness and giddiness: Secondary | ICD-10-CM

## 2014-01-31 DIAGNOSIS — R279 Unspecified lack of coordination: Secondary | ICD-10-CM

## 2014-01-31 DIAGNOSIS — R202 Paresthesia of skin: Secondary | ICD-10-CM

## 2014-01-31 DIAGNOSIS — R278 Other lack of coordination: Secondary | ICD-10-CM

## 2014-01-31 DIAGNOSIS — R209 Unspecified disturbances of skin sensation: Secondary | ICD-10-CM

## 2014-01-31 NOTE — Addendum Note (Signed)
Addended by: Star Age on: 01/31/2014 05:29 PM   Modules accepted: Orders

## 2014-01-31 NOTE — Progress Notes (Signed)
Subjective:    Patient ID: Erika [name] is a 43 y.o. female.  HPI    Star Age, MD, PhD [facility] [address]O. [place], [place] [address]  Erika [name] is a 43 year old right handed woman with an underlying medical history of abnormal uterine bleeding and migraines, who presents for new consultation for a history of numbness and dizziness and tremors. The patient is unaccompanied today. I met her on 02/12/2013, at which time she reported a several year history of migraine headaches with recent episode of migraine headache associated with speech impairment and lip numbness. I felt she had a recent episode of complicated migraine. We did further workup in the form of brain MRI without contrast which was done on 02/25/2013 and reported as normal. She also had a consultation with Dr. Leonie Man on 03/10/2013 for a bubble study to look for PFO. 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 Results were explained to the patient. Questions were answered by Dr. Leonie Man and he felt, that a trivial small shunt is unlikely to be of clinical significance and does not merit further evaluation or treatment considerations. She presents with a new history of numbness in her upper extremities complaints of dizziness and a fall in May of this year. She also has a complaint of tremors in her hands for the past 2 or 3 months. She was seen by her primary care provider on 01/05/2014 and had some blood work. She was advised to stop caffeine. She reports a host of other problems including tingling of her arms and legs, weakness in her arms and legs, fatigue, feet and her legs and feet, 10. Hearing loss, blurry vision, dizziness, forgetfulness, trouble finding words, falling, dropping things. Her biggest complaint is her tremors, which started in March 2015 and she noted it after she took a flight, and she did have  some anxiety during the flight, which had been only her second time flying and she has had an intermittent tremor ever since. She notices it when she holds something, no rest tremor, no FHx of tremor.  Her arm numbness is mostly on the L, but also on the R, off and on, no pattern, from the elbows down, most typically when she is outside in the heat.  She has not had a headache in months. She has not noted a difference when she stopped. She does report stressors, especially noticing symptoms, when she had issues with her mother in-law; since her father in-law passed away, there has been quite a bit of friction. Her husband tries to help. They have been married 20 years. They have a 41 year old and an 41 year old.  She had blood work on 01/05/14 with her PCP: CBC, arthritis panel, TSH, CMP, with results unknown to patient.  She has arthritis symptoms in her R thumb. Her knees hurt when going down stairs.  She had her eyes checked a year ago.   Her Past Medical History Is Significant For: Past Medical History  Diagnosis Date  . Headache(784.0)     history, otc med prn, last one 2 yrs ago  . Abnormal uterine bleeding   . Migraine with aura 02/12/2013  . Complicated migraine 3/00/9233    Her Past Surgical History Is Significant For: Past Surgical History  Procedure Laterality Date  . Dilation and curettage of uterus    . Cesarean section      x 2  .  Wisdom tooth extraction    . Tubal ligation    . Endometrial ablation      failed - done in MD office  . Cystoscopy  09/10/2011    Procedure: CYSTOSCOPY;  Surgeon: Felipa Emory, MD;  Location: [facility];  Service: Gynecology;  Laterality: N/A;  . Laparoscopic total hysterectomy      robotic    Her Family History Is Significant For: Family History  Problem Relation Age of Onset  . Diabetes Maternal Grandfather   . Diabetes Paternal Grandmother   . Diabetes Paternal Grandfather   . Kidney Stones Father   . Gallbladder disease Mother      Her Social History Is Significant For: History   Social History  . Marital Status: Married    Spouse Name: N/A    Number of Children: N/A  . Years of Education: N/A   Social History Main Topics  . Smoking status: Never Smoker   . Smokeless tobacco: Never Used  . Alcohol Use: 0.5 oz/week    1 drink(s) per week     Comment: occ alcohol  . Drug Use: No  . Sexual Activity: Yes    Partners: Male    Birth Control/ Protection: Surgical     Comment: robotic TLH   Other Topics Concern  . None   Social History Narrative  . None    Her Allergies Are:  Allergies  Allergen Reactions  . Penicillins Rash  . Zomig [Zolmitriptan] Rash  :   Her Current Medications Are:  Outpatient Encounter Prescriptions as of 01/31/2014  Medication Sig  . Aspirin-Acetaminophen-Caffeine (EXCEDRIN MIGRAINE PO) Take 1 tablet by mouth as needed.  Marland Kitchen ibuprofen (ADVIL,MOTRIN) 200 MG tablet Take 400 mg by mouth every 6 (six) hours as needed.  . promethazine (PHENERGAN) 12.5 MG tablet Take 1 tablet (12.5 mg total) by mouth every 12 (twelve) hours as needed for nausea (associated with Migraine).  :   Review of Systems:  Out of a complete 14 point review of systems, all are reviewed and negative with the exception of these symptoms as listed below:   Review of Systems  Constitutional: Positive for fatigue and unexpected weight change.  HENT: Positive for hearing loss and tinnitus.   Eyes: Positive for visual disturbance (blurred vision).  Respiratory: Negative.   Cardiovascular: Negative.   Gastrointestinal: Positive for constipation.  Endocrine: Negative.   Genitourinary: Positive for enuresis.  Musculoskeletal: Positive for arthralgias and myalgias.       Muscle cramps/spasms  Skin: Negative.   Allergic/Immunologic: Negative.   Neurological: Positive for dizziness, tremors, weakness and numbness.  Hematological: Negative.   Psychiatric/Behavioral: Positive for confusion and dysphoric mood.     Objective:  Neurologic Exam  Physical Exam Physical Examination:   Filed Vitals:   01/31/14 0814  BP: 112/71  Pulse: 75  Temp: 97.7 F (36.5 C)    General Examination: The patient is a very pleasant 43 y.o. female in no acute distress. She appears well-developed and well-nourished and well groomed.  She becomes tearful when we talked about her stressors.  HEENT: Normocephalic, atraumatic, pupils are equal, round and reactive to light and accommodation. Funduscopic exam is normal with sharp disc margins noted. Extraocular tracking is good without limitation to gaze excursion or nystagmus noted. Normal smooth pursuit is noted. Hearing is grossly intact. Tympanic membranes are clear bilaterally. Face is symmetric with normal facial animation and normal facial sensation. Speech is clear with no dysarthria noted. There is no hypophonia. There is no lip,  neck/head, jaw or voice tremor. Neck is supple with full range of passive and active motion. There are no carotid bruits on auscultation. Oropharynx exam reveals: mild mouth dryness. Tongue protrudes centrally and palate elevates symmetrically.    Chest: Clear to auscultation without wheezing, rhonchi or crackles noted.  Heart: S1+S2+0, regular and normal without murmurs, rubs or gallops noted.   Abdomen: Soft, non-tender and non-distended with normal bowel sounds appreciated on auscultation.  Extremities: There is no pitting edema in the distal lower extremities bilaterally. Pedal pulses are intact.  Skin: Warm and dry without trophic changes noted. There are no varicose veins.  Musculoskeletal: exam reveals no obvious joint deformities, tenderness or joint swelling or erythema.   Neurologically:  Mental status: The patient is awake, alert and oriented in all 4 spheres. Her memory, attention, language and knowledge are appropriate. There is no aphasia, agnosia, apraxia or anomia. Speech is clear with normal prosody and enunciation.  Thought process is linear. Mood is congruent and affect is normal.  Cranial nerves are as described above under HEENT exam. In addition, shoulder shrug is normal with equal shoulder height noted. Motor exam: Normal bulk, strength and tone is noted. There is no drift, or rebound. There is no resting tremor. There is no actual upper extremity postural or action tremor. On Archimedes spiral drawing there is no tremulousness noted. Handwriting is without significant tremulousness, and illegible. There is no evidence of micrographia.  Romberg is negative. Reflexes are 2+ throughout. Toes are downgoing bilaterally. Fine motor skills are intact with normal finger taps, normal hand movements, normal rapid alternating patting, normal foot taps and normal foot agility.  Cerebellar testing shows no dysmetria or intention tremor on finger to nose testing. Heel to shin is unremarkable bilaterally. There is no truncal or gait ataxia.  Sensory exam is intact to light touch, pinprick, vibration, temperature sense in the upper and lower extremities.  Gait, station and balance are unremarkable. No veering to one side is noted. No leaning to one side is noted. Posture is age-appropriate and stance is narrow based. No problems turning are noted. She turns en bloc. Tandem walk is unremarkable. Intact toe and heel stance is noted.               Assessment:   In summary, Erika [name] is a very pleasant 43 year old female with a history of migraine w aura and a history of complicated migraine last year, who presents with a 3 to four-month history of tremors, dizziness, and a host of other complaints such as weakness, clumsiness, dropping things, word finding difficulties. Her physical and neurological exam are non-focal and I reassured the patient in that regard. from my end of things and will add a few tests to rule out any structural or organic changes or explanations. However, stress seems to be a big player in her  symptomatology and she agrees. She is encouraged to talk to her primary care provider about dealing with her stress including utilizing a chin of an antidepressant. She would be open to trying one. She had some blood work at her PCP and she is encouraged to inquire the test results and maybe they can fax me a copy as well for completion. If all her test results from my end are normal we will call her and she can followup as needed. She was agreeable. Thankfully she has not had any recent migraines.  Most of my 40 minute visit today was spent in counseling and coordination of  care, reviewing prior test results.

## 2014-01-31 NOTE — Telephone Encounter (Signed)
Patient calling to state that she had labs done at her primary doctor in June but they lost it and is wondering if today's bloodwork can be used to get what her PCP was requesting. Please return call to patient and advise.

## 2014-01-31 NOTE — Telephone Encounter (Signed)
Pt calling stating that pt's PCP lost her bloodwork results and pt wanted to know if Dr. Rexene Alberts needed any other lab work done, other than what Dr. Rexene Alberts ordered for today. Pt wanted to know if Dr. Rexene Alberts could put that order in now, because pt just had blood drawn today, so pt would not have to get blood drawn again. Please advise

## 2014-01-31 NOTE — Patient Instructions (Signed)
I think stress is a big player in your symptomatology.  I will check a few things: MRI brain with and without contrast and blood work.  Please also talk to your PCP about your other blood test.  Please make an appointment with Erika [name] to discuss an antidepressant to help you with some of these symptoms. Your exam is normal, which is very reassuring.

## 2014-01-31 NOTE — Telephone Encounter (Signed)
Added labs, please see if they can be added to today's blood work.

## 2014-02-01 LAB — B12 AND FOLATE PANEL
FOLATE: 8 ng/mL (ref >3.0)
Vitamin B-12: 307 pg/mL (ref 211–946)

## 2014-02-01 LAB — FERRITIN: FERRITIN: 127 ng/mL (ref 15–150)

## 2014-02-01 LAB — IRON AND TIBC
IRON: 135 ug/dL (ref 35–155)
Iron Saturation: 40 % (ref 15–55)
TIBC: 336 ug/dL (ref 250–450)
UIBC: 201 ug/dL (ref 150–375)

## 2014-02-01 LAB — HGB A1C W/O EAG: Hgb A1c MFr Bld: 5.6 % (ref 4.8–5.6)

## 2014-02-01 LAB — VITAMIN D 25 HYDROXY (VIT D DEFICIENCY, FRACTURES): VIT D 25 HYDROXY: 34.1 ng/mL (ref 30.0–100.0)

## 2014-02-01 LAB — ALDOLASE: Aldolase: 13.2 U/L — ABNORMAL HIGH (ref 3.3–10.3)

## 2014-02-01 LAB — CK: Total CK: 114 U/L (ref 24–173)

## 2014-02-01 NOTE — Progress Notes (Signed)
Quick Note:  Please advise patient that her blood work looked fine. We checked iron studies, diabetes marker, vitamin D level, and muscle enzymes. One result (aldolase, which is a muscle enzyme) is pending and we will only call back about that if it is abnormal.  Erika Age, MD, PhD [facility] ([place]  ______

## 2014-02-01 NOTE — Telephone Encounter (Signed)
Called Erika [name] to inform her per Arlene, [facility], that Erika [name] needed to come back in to have more blood drawn for the other orders that Dr. Rexene Alberts has put in. Erika [name] stated that she would come in tomorrow afternoon. I advised the Erika [name] that if she has any other problems, questions or concerns to call the office. Erika [name] verbalized understanding. FYI

## 2014-02-02 ENCOUNTER — Other Ambulatory Visit (INDEPENDENT_AMBULATORY_CARE_PROVIDER_SITE_OTHER): Payer: Self-pay

## 2014-02-02 DIAGNOSIS — Z0289 Encounter for other administrative examinations: Secondary | ICD-10-CM

## 2014-02-02 NOTE — Progress Notes (Signed)
Quick Note:  Shared results with patient and she verbalized understanding ______

## 2014-02-03 LAB — COMPREHENSIVE METABOLIC PANEL
ALT: 36 IU/L — ABNORMAL HIGH (ref 0–32)
AST: 26 IU/L (ref 0–40)
Albumin/Globulin Ratio: 2.4 (ref 1.1–2.5)
Albumin: 4.5 g/dL (ref 3.5–5.5)
Alkaline Phosphatase: 108 IU/L (ref 39–117)
BUN/Creatinine Ratio: 16 (ref 9–23)
BUN: 14 mg/dL (ref 6–24)
CALCIUM: 9.8 mg/dL (ref 8.7–10.2)
CHLORIDE: 101 mmol/L (ref 97–108)
CO2: 22 mmol/L (ref 18–29)
Creatinine, Ser: 0.88 mg/dL (ref 0.57–1.00)
GFR calc Af Amer: 95 mL/min/{1.73_m2} (ref >59)
GFR calc non Af Amer: 82 mL/min/{1.73_m2} (ref >59)
Globulin, Total: 1.9 g/dL (ref 1.5–4.5)
Glucose: 91 mg/dL (ref 65–99)
Potassium: 4 mmol/L (ref 3.5–5.2)
SODIUM: 140 mmol/L (ref 134–144)
Total Bilirubin: 0.3 mg/dL (ref 0.0–1.2)
Total Protein: 6.4 g/dL (ref 6.0–8.5)

## 2014-02-03 LAB — CBC WITH DIFFERENTIAL
BASOS ABS: 0 10*3/uL (ref 0.0–0.2)
Basos: 0 %
EOS ABS: 0.1 10*3/uL (ref 0.0–0.4)
Eos: 1 %
HCT: 42 % (ref 34.0–46.6)
Hemoglobin: 14.6 g/dL (ref 11.1–15.9)
IMMATURE GRANS (ABS): 0 10*3/uL (ref 0.0–0.1)
IMMATURE GRANULOCYTES: 0 %
Lymphocytes Absolute: 2 10*3/uL (ref 0.7–3.1)
Lymphs: 25 %
MCH: 31.1 pg (ref 26.6–33.0)
MCHC: 34.8 g/dL (ref 31.5–35.7)
MCV: 89 fL (ref 79–97)
MONOS ABS: 0.5 10*3/uL (ref 0.1–0.9)
Monocytes: 7 %
NEUTROS PCT: 67 %
Neutrophils Absolute: 5.5 10*3/uL (ref 1.4–7.0)
PLATELETS: 340 10*3/uL (ref 150–379)
RBC: 4.7 x10E6/uL (ref 3.77–5.28)
RDW: 13.3 % (ref 12.3–15.4)
WBC: 8.2 10*3/uL (ref 3.4–10.8)

## 2014-02-03 LAB — ANA W/REFLEX: ANA: NEGATIVE

## 2014-02-03 LAB — RHEUMATOID FACTOR: Rhuematoid fact SerPl-aCnc: 8.3 IU/mL (ref 0.0–13.9)

## 2014-02-03 LAB — RPR: RPR: NONREACTIVE

## 2014-02-03 LAB — SEDIMENTATION RATE: Sed Rate: 5 mm/hr (ref 0–32)

## 2014-02-03 LAB — TSH: TSH: 2.17 u[IU]/mL (ref 0.450–4.500)

## 2014-02-03 LAB — C-REACTIVE PROTEIN: CRP: 2.6 mg/L (ref 0.0–4.9)

## 2014-02-03 NOTE — Progress Notes (Signed)
Quick Note:  Please call pt: All labs normal, including the added labs recently, with the exception of 2 minor thing: Liver enzyme ALT is slightly above normal, which in isolation is nothing to worry about and can be monitored again with routine blood work. Muscle enzyme aldolase also slightly above normal but again in isolation non-specific and not high enough to do additional testing and with a normal CK level (another muscle enzyme) nothing to worry about.  Star Age, MD, PhD [facility] ([facility]  ______

## 2014-02-07 NOTE — Progress Notes (Signed)
Quick Note:  Pt called and I gave her the results of the labs, she did get from mychart the results. I gave her the result note comments. She verbalized understanding. ______

## 2014-05-23 ENCOUNTER — Encounter: Payer: Self-pay | Admitting: Neurology

## 2014-06-02 ENCOUNTER — Ambulatory Visit (INDEPENDENT_AMBULATORY_CARE_PROVIDER_SITE_OTHER): Payer: Managed Care, Other (non HMO) | Admitting: Obstetrics & Gynecology

## 2014-06-02 ENCOUNTER — Encounter: Payer: Self-pay | Admitting: Obstetrics & Gynecology

## 2014-06-02 VITALS — BP 116/60 | HR 64 | Resp 12 | Ht 62.5 in | Wt 204.6 lb

## 2014-06-02 DIAGNOSIS — Z23 Encounter for immunization: Secondary | ICD-10-CM

## 2014-06-02 DIAGNOSIS — Z01419 Encounter for gynecological examination (general) (routine) without abnormal findings: Secondary | ICD-10-CM

## 2014-06-02 DIAGNOSIS — Z0000 Encounter for general adult medical examination without abnormal findings: Secondary | ICD-10-CM

## 2014-06-02 LAB — POCT URINALYSIS DIPSTICK
Bilirubin, UA: NEGATIVE
Glucose, UA: NEGATIVE
Ketones, UA: NEGATIVE
Leukocytes, UA: NEGATIVE
Nitrite, UA: NEGATIVE
PH UA: 8
Protein, UA: NEGATIVE
UROBILINOGEN UA: NEGATIVE

## 2014-06-02 NOTE — Progress Notes (Signed)
43 y.o. G3P2 MarriedCaucasianF here for annual exam.  Going to [place] in January on an 8 day cruise.    Patient's last menstrual period was 08/20/2011.          Sexually active: Yes.    The current method of family planning is status post hysterectomy.    Exercising: Yes.    walking Smoker:  no  Health Maintenance: Pap:  02/07/11 WNL History of abnormal Pap:  no MMG:  08/03/13 3D-normal Colonoscopy:  12/13-repeat in 10 years BMD:   none TDaP: today Screening Labs: PCP, Hb today: PCP, Urine today: PH-8.0, RBC-trace   reports that she has never smoked. She has never used smokeless tobacco. She reports that she drinks alcohol. She reports that she does not use illicit drugs.  Past Medical History  Diagnosis Date  . Headache(784.0)     history, otc med prn, last one 2 yrs ago  . Abnormal uterine bleeding   . Migraine with aura 02/12/2013  . Complicated migraine 0/38/8828  . Stress     saw neurologist 6/15 for numbess in face and arm    Past Surgical History  Procedure Laterality Date  . Dilation and curettage of uterus    . Cesarean section      x 2  . Wisdom tooth extraction    . Tubal ligation    . Endometrial ablation      failed - done in MD office  . Cystoscopy  09/10/2011    Procedure: CYSTOSCOPY;  Surgeon: Felipa Emory, MD;  Location: [facility];  Service: Gynecology;  Laterality: N/A;  . Laparoscopic total hysterectomy      robotic    Current Outpatient Prescriptions  Medication Sig Dispense Refill  . Aspirin-Acetaminophen-Caffeine (EXCEDRIN MIGRAINE PO) Take 1 tablet by mouth as needed.    Marland Kitchen ibuprofen (ADVIL,MOTRIN) 200 MG tablet Take 400 mg by mouth every 6 (six) hours as needed.    . promethazine (PHENERGAN) 12.5 MG tablet Take 1 tablet (12.5 mg total) by mouth every 12 (twelve) hours as needed for nausea (associated with Migraine). 30 tablet 1   No current facility-administered medications for this visit.    Family History  Problem Relation Age of Onset  .  Diabetes Maternal Grandfather   . Diabetes Paternal Grandmother   . Diabetes Paternal Grandfather   . Kidney Stones Father   . Gallbladder disease Mother     ROS:  Pertinent items are noted in HPI.  Otherwise, a comprehensive ROS was negative.  Exam:   BP 116/60 mmHg  Pulse 64  Resp 12  Ht 5' 2.5" (1.588 m)  Wt 204 lb 9.6 oz (92.806 kg)  BMI 36.80 kg/m2  LMP 08/20/2011  Weight change: +10#   Height: 5' 2.5" (158.8 cm)  Ht Readings from Last 3 Encounters:  06/02/14 5' 2.5" (1.588 m)  01/31/14 5' 2.5" (1.588 m)  01/05/14 5' 2.5" (1.588 m)    General appearance: alert, cooperative and appears stated age Head: Normocephalic, without obvious abnormality, atraumatic Neck: no adenopathy, supple, symmetrical, trachea midline and thyroid normal to inspection and palpation Lungs: clear to auscultation bilaterally Breasts: normal appearance, no masses or tenderness Heart: regular rate and rhythm Abdomen: soft, non-tender; bowel sounds normal; no masses,  no organomegaly Extremities: extremities normal, atraumatic, no cyanosis or edema Skin: Skin color, texture, turgor normal. No rashes or lesions Lymph nodes: Cervical, supraclavicular, and axillary nodes normal. No abnormal inguinal nodes palpated Neurologic: Grossly normal   Pelvic: External genitalia:  no lesions  Urethra:  normal appearing urethra with no masses, tenderness or lesions              Bartholins and Skenes: normal                 Vagina: normal appearing vagina with normal color and discharge, no lesions              Cervix: absent              Pap taken: No. Bimanual Exam:  Uterus:  uterus absent              Adnexa: normal adnexa and no mass, fullness, tenderness               Rectovaginal: Confirms               Anus:  normal sphincter tone, no lesions  A:  Well Woman with normal exam Migraines Atypical neurologic symptoms with evaluation with Dr. Rexene Alberts.  Dx:  stress induced H/O TLH/bilateral  salpingectomy Family hx of colon cancer  P: Mammogram guidelines d/w pt Colonoscopy 2013. F/U 10 years.  pap smear not indicated Tdap today return annually or prn  An After Visit Summary was printed and given to the patient.

## 2014-07-25 ENCOUNTER — Other Ambulatory Visit: Payer: Self-pay

## 2014-07-25 DIAGNOSIS — Z1231 Encounter for screening mammogram for malignant neoplasm of breast: Secondary | ICD-10-CM

## 2014-08-04 ENCOUNTER — Ambulatory Visit
Admission: RE | Admit: 2014-08-04 | Discharge: 2014-08-04 | Disposition: A | Discharge status: Home / Self Care | Payer: BLUE CROSS/BLUE SHIELD | Source: Ambulatory Visit

## 2014-08-04 DIAGNOSIS — Z1231 Encounter for screening mammogram for malignant neoplasm of breast: Secondary | ICD-10-CM

## 2015-07-03 ENCOUNTER — Other Ambulatory Visit: Payer: Self-pay

## 2015-07-03 DIAGNOSIS — Z1231 Encounter for screening mammogram for malignant neoplasm of breast: Secondary | ICD-10-CM

## 2015-08-04 ENCOUNTER — Ambulatory Visit (INDEPENDENT_AMBULATORY_CARE_PROVIDER_SITE_OTHER): Payer: BLUE CROSS/BLUE SHIELD | Admitting: Obstetrics & Gynecology

## 2015-08-04 ENCOUNTER — Encounter: Payer: Self-pay | Admitting: Obstetrics & Gynecology

## 2015-08-04 VITALS — BP 100/60 | HR 72 | Resp 16 | Ht 62.0 in | Wt 201.0 lb

## 2015-08-04 DIAGNOSIS — Z01419 Encounter for gynecological examination (general) (routine) without abnormal findings: Secondary | ICD-10-CM | POA: Diagnosis not present

## 2015-08-04 MED ORDER — DIAZEPAM 5 MG PO TABS: ORAL_TABLET | ORAL | Status: DC

## 2015-08-04 NOTE — Progress Notes (Signed)
45 y.o. G3P2 MarriedCaucasianF here for annual exam.  Doing well.  Denies vaginal bleeding.  Going on a trip to [place].  Has some anxiety regarding flying.  PCP:  Dr. Evelina Dun.  Did blood work there this year.  Patient's last menstrual period was 08/20/2011.          Sexually active: Yes.    The current method of family planning is status post hysterectomy.    Exercising: Yes.    Walking  Smoker:  no  Health Maintenance: Pap:  02/07/11 Neg History of abnormal Pap:  no MMG:  08/04/14 BIRADS1:neg. Has appt scheduled 08/18/15  Colonoscopy:  06/2012 Normal. Repeat 10 years  BMD:   Never TDaP:  06/02/14 Screening Labs: PCP, Hb today: PCP, Urine today: PCP   reports that she has never smoked. She has never used smokeless tobacco. She reports that she drinks alcohol. She reports that she does not use illicit drugs.  Past Medical History  Diagnosis Date  . Headache(784.0)     history, otc med prn, last one 2 yrs ago  . Abnormal uterine bleeding   . Migraine with aura 02/12/2013  . Complicated migraine [id number]  . Stress     saw neurologist 6/15 for numbess in face and arm    Past Surgical History  Procedure Laterality Date  . Dilation and curettage of uterus    . Cesarean section      x 2  . Wisdom tooth extraction    . Tubal ligation    . Endometrial ablation      failed - done in MD office  . Cystoscopy  09/10/2011    Procedure: CYSTOSCOPY;  Surgeon: Felipa Emory, MD;  Location: [facility];  Service: Gynecology;  Laterality: N/A;  . Laparoscopic total hysterectomy      robotic    Current Outpatient Prescriptions  Medication Sig Dispense Refill  . Aspirin-Acetaminophen-Caffeine (EXCEDRIN MIGRAINE PO) Take 1 tablet by mouth as needed.    Marland Kitchen ibuprofen (ADVIL,MOTRIN) 200 MG tablet Take 400 mg by mouth every 6 (six) hours as needed.    . promethazine (PHENERGAN) 12.5 MG tablet Take 1 tablet (12.5 mg total) by mouth every 12 (twelve) hours as needed for nausea  (associated with Migraine). (Patient not taking: Reported on 08/04/2015) 30 tablet 1   No current facility-administered medications for this visit.    Family History  Problem Relation Age of Onset  . Diabetes Maternal Grandfather   . Diabetes Paternal Grandmother   . Diabetes Paternal Grandfather   . Kidney Stones Father   . Gallbladder disease Mother   . Colon cancer Maternal Aunt 76  . Colon cancer Cousin 53    paternal cousin    ROS:  Pertinent items are noted in HPI.  Otherwise, a comprehensive ROS was negative.  Exam:   BP 100/60 mmHg  Pulse 72  Resp 16  Ht 5\' 2"  (1.575 m)  Wt 201 lb (91.173 kg)  BMI 36.75 kg/m2  LMP 08/20/2011  Weight change: +7# Height: 5\' 2"  (157.5 cm)  Ht Readings from Last 3 Encounters:  08/04/15 5\' 2"  (1.575 m)  06/02/14 5' 2.5" (1.588 m)  01/31/14 5' 2.5" (1.588 m)    General appearance: alert, cooperative and appears stated age Head: Normocephalic, without obvious abnormality, atraumatic Neck: no adenopathy, supple, symmetrical, trachea midline and thyroid normal to inspection and palpation Lungs: clear to auscultation bilaterally Breasts: normal appearance, no masses or tenderness Heart: regular rate and rhythm Abdomen: soft, non-tender; bowel  sounds normal; no masses,  no organomegaly Extremities: extremities normal, atraumatic, no cyanosis or edema Skin: Skin color, texture, turgor normal. No rashes or lesions Lymph nodes: Cervical, supraclavicular, and axillary nodes normal. No abnormal inguinal nodes palpated Neurologic: Grossly normal   Pelvic: External genitalia:  no lesions              Urethra:  normal appearing urethra with no masses, tenderness or lesions              Bartholins and Skenes: normal                 Vagina: normal appearing vagina with normal color and discharge, no lesions              Cervix: absent              Pap taken: No. Bimanual Exam:  Uterus:  uterus absent              Adnexa: normal adnexa                Rectovaginal: Confirms               Anus:  normal sphincter tone, no lesions  Chaperone was present for exam.  A:  Well Woman with normal exam Migraines Atypical neurologic symptoms with neurological evaluation with Dr. Rexene Alberts. Dx: stress induced H/O TLH/bilateral salpingectomy 2/13 Family hx of colon cancer Anxiety with flying  P: Mammogram yearly Colonoscopy 2013. F/U 10 years pap smear not indicated Labs with PCP yearly Valium 5mg , one hour before flying and pt can repeat in 30 minutes.  #10/0RF. return annually or prn

## 2015-08-17 ENCOUNTER — Ambulatory Visit
Admission: RE | Admit: 2015-08-17 | Discharge: 2015-08-17 | Disposition: A | Discharge status: Home / Self Care | Payer: BLUE CROSS/BLUE SHIELD | Source: Ambulatory Visit

## 2015-08-17 DIAGNOSIS — Z1231 Encounter for screening mammogram for malignant neoplasm of breast: Secondary | ICD-10-CM

## 2015-08-18 ENCOUNTER — Ambulatory Visit: Payer: BLUE CROSS/BLUE SHIELD

## 2015-11-02 ENCOUNTER — Emergency Department (HOSPITAL_BASED_OUTPATIENT_CLINIC_OR_DEPARTMENT_OTHER)
Admission: EM | Admit: 2015-11-02 | Discharge: 2015-11-03 | Disposition: A | Discharge status: Home / Self Care | Payer: BLUE CROSS/BLUE SHIELD | Attending: Emergency Medicine | Admitting: Emergency Medicine

## 2015-11-02 ENCOUNTER — Encounter (HOSPITAL_BASED_OUTPATIENT_CLINIC_OR_DEPARTMENT_OTHER): Payer: Self-pay

## 2015-11-02 ENCOUNTER — Other Ambulatory Visit: Payer: Self-pay

## 2015-11-02 ENCOUNTER — Emergency Department (HOSPITAL_BASED_OUTPATIENT_CLINIC_OR_DEPARTMENT_OTHER): Payer: BLUE CROSS/BLUE SHIELD

## 2015-11-02 DIAGNOSIS — R079 Chest pain, unspecified: Secondary | ICD-10-CM

## 2015-11-02 DIAGNOSIS — Z8669 Personal history of other diseases of the nervous system and sense organs: Secondary | ICD-10-CM | POA: Diagnosis not present

## 2015-11-02 DIAGNOSIS — R072 Precordial pain: Secondary | ICD-10-CM | POA: Insufficient documentation

## 2015-11-02 DIAGNOSIS — R202 Paresthesia of skin: Secondary | ICD-10-CM | POA: Insufficient documentation

## 2015-11-02 DIAGNOSIS — R55 Syncope and collapse: Secondary | ICD-10-CM | POA: Diagnosis not present

## 2015-11-02 LAB — CBC
HEMATOCRIT: 42.8 % (ref 36.0–46.0)
HEMOGLOBIN: 14.5 g/dL (ref 12.0–15.0)
MCH: 31 pg (ref 26.0–34.0)
MCHC: 33.9 g/dL (ref 30.0–36.0)
MCV: 91.6 fL (ref 78.0–100.0)
Platelets: 324 10*3/uL (ref 150–400)
RBC: 4.67 MIL/uL (ref 3.87–5.11)
RDW: 13 % (ref 11.5–15.5)
WBC: 10.4 10*3/uL (ref 4.0–10.5)

## 2015-11-02 LAB — BASIC METABOLIC PANEL
ANION GAP: 7 (ref 5–15)
BUN: 14 mg/dL (ref 6–20)
CALCIUM: 9.2 mg/dL (ref 8.9–10.3)
CO2: 28 mmol/L (ref 22–32)
Chloride: 103 mmol/L (ref 101–111)
Creatinine, Ser: 0.89 mg/dL (ref 0.44–1.00)
Glucose, Bld: 127 mg/dL — ABNORMAL HIGH (ref 65–99)
POTASSIUM: 4.3 mmol/L (ref 3.5–5.1)
Sodium: 138 mmol/L (ref 135–145)

## 2015-11-02 LAB — D-DIMER, QUANTITATIVE (NOT AT ARMC)

## 2015-11-02 LAB — TROPONIN I

## 2015-11-02 NOTE — ED Notes (Signed)
Pt c/o central chest burning for the last thirty minutes with a "blacking out period" per spouse.  Pt denies SOB, c/o weak feeling and "tingling" in both arms, worse in left arm.

## 2015-11-03 LAB — TROPONIN I: Troponin I: <0.03 ng/mL (ref <0.031)

## 2015-11-03 NOTE — ED Provider Notes (Signed)
CSN: [id number]     Arrival date & time 11/02/15  2013 History   First MD Initiated Contact with Patient 11/02/15 2137     Chief Complaint  Patient presents with  . Chest Pain     (Consider location/radiation/quality/duration/timing/severity/associated sxs/prior Treatment) HPI Comments: Patient presents today with complaint of chest pain and syncope.  She states that around 7 PM this evening she had an episode of 'burning" substernal chest pain.  Pain lasted for approximately ten seconds and then she had a witnessed syncopal episode.  Husband who witnessed the syncopal episode states that she loss consciousness for approximately one minute.  She did not hit her head when she loss consciousness.  Her husband states that her back hit the wall and then he grabbed her.  Aside from generalized weakness patient felt like she was back to her normal self after she regained consciousness.  No headache or confusion.  Husband states that he did not witness any seizure like activity.  Patient denies any chest pain or SOB at this time.  Denies dizziness, nausea, vomiting, vision changes, focal weakness, LE edema/pain, facial asymmetry, headache, numbness, tingling, or any other symptoms.  She denies any prior cardiac history.  No history of HTN, DM, or Hyperlipidemia.  She does not smoke.  No history of DVT or PE.  She does report that she recently traveled to [place] last week.  Denies exogenous estrogen use.    The history is provided by the patient.    Past Medical History  Diagnosis Date  . Headache(784.0)     history, otc med prn, last one 2 yrs ago  . Abnormal uterine bleeding   . Migraine with aura 02/12/2013  . Complicated migraine [id number]  . Stress     saw neurologist 6/15 for numbess in face and arm   Past Surgical History  Procedure Laterality Date  . Dilation and curettage of uterus    . Cesarean section      x 2  . Wisdom tooth extraction    . Tubal ligation    . Endometrial  ablation      failed - done in MD office  . Cystoscopy  09/10/2011    Procedure: CYSTOSCOPY;  Surgeon: Felipa Emory, MD;  Location: [facility];  Service: Gynecology;  Laterality: N/A;  . Laparoscopic total hysterectomy      robotic   Family History  Problem Relation Age of Onset  . Diabetes Maternal Grandfather   . Diabetes Paternal Grandmother   . Diabetes Paternal Grandfather   . Kidney Stones Father   . Gallbladder disease Mother   . Colon cancer Maternal Aunt 79  . Colon cancer Cousin 67    paternal cousin   Social History  Substance Use Topics  . Smoking status: Never Smoker   . Smokeless tobacco: Never Used  . Alcohol Use: 0.0 oz/week    0 Standard drinks or equivalent per week     Comment: occ   OB History    Gravida Para Term Preterm AB TAB SAB Ectopic Multiple Living   3 2        2      Review of Systems  All other systems reviewed and are negative.     Allergies  Penicillins and Zomig  Home Medications   Prior to Admission medications   Medication Sig Start Date End Date Taking? Authorizing Provider  Aspirin-Acetaminophen-Caffeine (EXCEDRIN MIGRAINE PO) Take 1 tablet by mouth as needed.    Historical Provider,  MD  diazepam (VALIUM) 5 MG tablet Take 1 at least an hour before flying.  Can repeat in 30 minutes if needed. 08/04/15   Megan Salon, MD  ibuprofen (ADVIL,MOTRIN) 200 MG tablet Take 400 mg by mouth every 6 (six) hours as needed.    Historical Provider, MD  promethazine (PHENERGAN) 12.5 MG tablet Take 1 tablet (12.5 mg total) by mouth every 12 (twelve) hours as needed for nausea (associated with Migraine). Patient not taking: Reported on 08/04/2015 02/12/13   Philmore Pali, NP   BP 118/66 mmHg  Pulse 84  Temp(Src) 97.8 F (36.6 C) (Oral)  Resp 16  Ht 5\' 3"  (1.6 m)  Wt 95.255 kg  BMI 37.21 kg/m2  SpO2 99%  LMP 08/20/2011 Physical Exam  Constitutional: She appears well-developed and well-nourished.  HENT:  Head: Normocephalic and atraumatic.   Mouth/Throat: Oropharynx is clear and moist.  Neck: Normal range of motion. Neck supple.  Cardiovascular: Normal rate, regular rhythm and normal heart sounds.   Pulses:      Dorsalis pedis pulses are 2+ on the right side, and 2+ on the left side.  Pulmonary/Chest: Effort normal and breath sounds normal. No respiratory distress. She has no wheezes. She has no rales. She exhibits no tenderness.  Abdominal: Soft. There is no tenderness.  Musculoskeletal: Normal range of motion.  No LE edema or erythema  Neurological: She is alert. She has normal strength. No cranial nerve deficit or sensory deficit. Coordination and gait normal.  Skin: Skin is warm and dry.  Psychiatric: She has a normal mood and affect.  Nursing note and vitals reviewed.   ED Course  Procedures (including critical care time) Labs Review Labs Reviewed  BASIC METABOLIC PANEL - Abnormal; Notable for the following:    Glucose, Bld 127 (*)    All other components within normal limits  CBC  TROPONIN I  D-DIMER, QUANTITATIVE (NOT AT [facility])  TROPONIN I    Imaging Review Dg Chest 2 View  11/02/2015  CLINICAL DATA:  Central chest pain. EXAM: CHEST  2 VIEW COMPARISON:  None. FINDINGS: The heart size and mediastinal contours are within normal limits. Both lungs are clear. The visualized skeletal structures are unremarkable. IMPRESSION: No active cardiopulmonary disease. Electronically Signed   By: Marin Olp M.D.   On: 11/02/2015 20:58   I have personally reviewed and evaluated these images and lab results as part of my medical decision-making.   EKG Interpretation   Date/Time:  Thursday November 02 2015 20:22:26 EDT Ventricular Rate:  78 PR Interval:  134 QRS Duration: 92 QT Interval:  362 QTC Calculation: 412 R Axis:   58 Text Interpretation:  Normal sinus rhythm Normal ECG No old tracing to  compare Confirmed by FLOYD MD, DANIEL [id number]) on 11/02/2015 10:13:08 PM     Today's Vitals   11/02/15 2019 11/02/15 2020  11/02/15 2241  BP: 141/70  118/66  Pulse: 85  84  Temp: 97.8 F (36.6 C)    TempSrc: Oral    Resp: 18  16  Height: 5\' 3"  (1.6 m)    Weight: 95.255 kg    SpO2: 99%  99%  PainSc:  6     MDM   Final diagnoses:  None   Patient presents today after a syncopal episode.  She reports a burning substernal chest pain that occurred for 10 seconds prior to syncopal episode.  She is asymptomatic in the ED.  VSS.  No hypoxia or hypotension.  No ischemic changes on  EKG.  Initial and delta troponin are negative.  CXR is negative.  D-dimer is also negative.  Labs unremarkable.  Patient has a normal neurological exam.  Feel that the patient is stable for discharge.  Return precautions given.  Patient also discussed with Dr. Tyrone Nine who is in agreement with the plan.  Return precautions given.    Hyman Bible, PA-C 11/03/15 [place]er, PA-C 11/03/15 [place], DO 11/06/15 [phone]

## 2015-11-06 ENCOUNTER — Encounter: Payer: Self-pay | Admitting: Family Medicine

## 2015-11-06 ENCOUNTER — Ambulatory Visit (INDEPENDENT_AMBULATORY_CARE_PROVIDER_SITE_OTHER): Payer: BLUE CROSS/BLUE SHIELD | Admitting: Family Medicine

## 2015-11-06 VITALS — BP 114/73 | HR 69 | Temp 98.6°F | Ht 63.0 in | Wt 210.2 lb

## 2015-11-06 DIAGNOSIS — R55 Syncope and collapse: Secondary | ICD-10-CM | POA: Diagnosis not present

## 2015-11-06 NOTE — Progress Notes (Signed)
BP 114/73 mmHg  Pulse 69  Temp(Src) 98.6 F (37 C) (Oral)  Ht 5\' 3"  (1.6 m)  Wt 210 lb 3.2 oz (95.346 kg)  BMI 37.24 kg/m2  LMP 08/20/2011   Subjective:    Patient ID: Erika [name], female    DOB: 1971/05/08, 45 y.o.   MRN: [id number]  HPI: Erika [name] is a 45 y.o. female presenting on 11/06/2015 for ER Followup   HPI ER follow-up for syncopal episodes and chest burning Patient had an episode of initially feeling chest burning after eating that led her to have a syncopal episode. She was out for a minute or 2. Her husband was there watching her denies any seizures or convulsions of time. She did not hit her head. She was taken to the emergency department and they did EKGs and chest x-ray and labs including troponin and did not find anything that could've been the cause of your syncope or chest pain. She describes the chest pain is in the central burning going up into her throat. She says she did have some acid issues when she was pregnant but none since. Since that day 4 days ago she has not had any other syncopal episodes or chest burning or chest pains. She denies any headaches or sinus congestion. She denies any shortness of breath or wheezing.  Relevant past medical, surgical, family and social history reviewed and updated as indicated. Interim medical history since our last visit reviewed. Allergies and medications reviewed and updated.  Review of Systems  Constitutional: Negative for fever and chills.  HENT: Negative for congestion, ear discharge, ear pain, sinus pressure and sneezing.   Eyes: Negative for redness and visual disturbance.  Respiratory: Negative for cough, chest tightness and shortness of breath.   Cardiovascular: Positive for chest pain. Negative for leg swelling.  Gastrointestinal: Positive for abdominal pain. Negative for nausea, vomiting, diarrhea, constipation, blood in stool and anal bleeding.  Genitourinary: Negative for dysuria, frequency and difficulty  urinating.  Musculoskeletal: Negative for back pain and gait problem.  Skin: Negative for rash.  Neurological: Positive for syncope and light-headedness. Negative for tremors, seizures, facial asymmetry, speech difficulty, weakness, numbness and headaches.  Psychiatric/Behavioral: Negative for behavioral problems and agitation.  All other systems reviewed and are negative.   Per HPI unless specifically indicated above     Medication List       This list is accurate as of: 11/06/15 11:55 AM.  Always use your most recent med list.               EXCEDRIN MIGRAINE PO  Take 1 tablet by mouth as needed.     ibuprofen 200 MG tablet  Commonly known as:  ADVIL,MOTRIN  Take 400 mg by mouth every 6 (six) hours as needed.           Objective:    BP 114/73 mmHg  Pulse 69  Temp(Src) 98.6 F (37 C) (Oral)  Ht 5\' 3"  (1.6 m)  Wt 210 lb 3.2 oz (95.346 kg)  BMI 37.24 kg/m2  LMP 08/20/2011  Wt Readings from Last 3 Encounters:  11/06/15 210 lb 3.2 oz (95.346 kg)  11/02/15 210 lb (95.255 kg)  08/04/15 201 lb (91.173 kg)    Physical Exam  Constitutional: She is oriented to person, place, and time. She appears well-developed and well-nourished. No distress.  HENT:  Right Ear: External ear normal.  Left Ear: External ear normal.  Nose: Nose normal.  Mouth/Throat: Oropharynx is clear and moist.  No oropharyngeal exudate.  Eyes: Conjunctivae and EOM are normal. Pupils are equal, round, and reactive to light.  Neck: Neck supple. No thyromegaly present.  Cardiovascular: Normal rate, regular rhythm, normal heart sounds and intact distal pulses.   No murmur heard. Pulmonary/Chest: Effort normal and breath sounds normal. No respiratory distress. She has no wheezes.  Abdominal: Soft. Bowel sounds are normal. She exhibits no distension. There is no tenderness. There is no rebound and no guarding.  Musculoskeletal: Normal range of motion. She exhibits no edema or tenderness.  Lymphadenopathy:     She has no cervical adenopathy.  Neurological: She is alert and oriented to person, place, and time. She has normal reflexes. She displays normal reflexes. No cranial nerve deficit. She exhibits normal muscle tone. Coordination normal.  Skin: Skin is warm and dry. No rash noted. She is not diaphoretic.  Psychiatric: She has a normal mood and affect. Her behavior is normal.  Nursing note and vitals reviewed.   Results for orders placed or performed during the hospital encounter of [id number]  Basic metabolic panel  Result Value Ref Range   Sodium 138 135 - 145 mmol/L   Potassium 4.3 3.5 - 5.1 mmol/L   Chloride 103 101 - 111 mmol/L   CO2 28 22 - 32 mmol/L   Glucose, Bld 127 (H) 65 - 99 mg/dL   BUN 14 6 - 20 mg/dL   Creatinine, Ser 0.89 0.44 - 1.00 mg/dL   Calcium 9.2 8.9 - 10.3 mg/dL   GFR calc non Af Amer >60 >60 mL/min   GFR calc Af Amer >60 >60 mL/min   Anion gap 7 5 - 15  CBC  Result Value Ref Range   WBC 10.4 4.0 - 10.5 K/uL   RBC 4.67 3.87 - 5.11 MIL/uL   Hemoglobin 14.5 12.0 - 15.0 g/dL   HCT 42.8 36.0 - 46.0 %   MCV 91.6 78.0 - 100.0 fL   MCH 31.0 26.0 - 34.0 pg   MCHC 33.9 30.0 - 36.0 g/dL   RDW 13.0 11.5 - 15.5 %   Platelets 324 150 - 400 K/uL  Troponin I  Result Value Ref Range   Troponin I <0.03 <0.031 ng/mL  D-dimer, quantitative (not at [facility])  Result Value Ref Range   D-Dimer, Quant <0.27 0.00 - 0.50 ug/mL-FEU  Troponin I  Result Value Ref Range   Troponin I <0.03 <0.031 ng/mL      Assessment & Plan:   Problem List Items Addressed This Visit    None    Visit Diagnoses    Syncope, unspecified syncope type    -  Primary    Patient had a syncopal episode, following up from the ER visit from that, they did extensive workup. Thinking it could've been vasovagal because of heartburn       Patient will keep record of any time she feels lightheaded or dizzy or has a syncopal episode and the events surrounding it and her emotions surrounding it and see if we  can use that to help get to the bottom of why this is happening.  Follow up plan: Return in about 4 weeks (around 12/04/2015), or if symptoms worsen or fail to improve, for Adult well exam and labs.  Counseling provided for all of the vaccine components No orders of the defined types were placed in this encounter.    Caryl Pina, MD [facility] 11/06/2015, 11:55 AM

## 2015-11-16 ENCOUNTER — Encounter: Payer: Self-pay | Admitting: Obstetrics & Gynecology

## 2015-11-16 ENCOUNTER — Telehealth: Payer: Self-pay

## 2015-11-16 DIAGNOSIS — Z87898 Personal history of other specified conditions: Secondary | ICD-10-CM

## 2015-11-16 NOTE — Telephone Encounter (Signed)
Telephone encounter created to discuss mychart message with Dr.Silva as Dr.Miller is out of the office this week.

## 2015-11-16 NOTE — Telephone Encounter (Signed)
Visit Follow-Up Question  Message [phone]   From  [name]   To  Megan Salon, MD   Sent  11/16/2015 1:34 PM     I had an episode a couple weeks ago. sharp burning pain middle of my chest and then I passed out without any warning. I went to the ER, they did ekg, chest x ray and ran blood tests to make sure I wasn't having a heart attack. I followed up with my family dr like they said. the dr I saw didn't want to do anymore tests, nor referred me to a cardiologist or anyone else for further tests. can you look over my chart and let me know if you think I should see someone else. I am just looking for piece of mind. I have not had any more symptoms since then. the family dr said he thought it was heart burn. but I didn't think that would cause me to pass out.  thank you :)  Erika [name]      Responsible Party    Pool - [facility] Pool No one has taken responsibility for this message.     No actions have been taken on this message.     Routing to [place] for review and advise as Dr.Miller is out of the office this week.

## 2015-11-16 NOTE — Telephone Encounter (Signed)
[facility] for referral to cardiology - [facility] - first available.

## 2015-11-17 NOTE — Telephone Encounter (Signed)
Spoke with patient. Advised of message as seen below from [place]. She is agreeable. Referral placed to [facility]. She is aware they will contact her directly to set up her appointment date and time after reviewing her chart. She is agreeable and verbalizes understanding.  Cc: Theresia Lo regarding referral  Routing to provider for final review. Patient agreeable to disposition. Will close encounter.

## 2015-11-28 NOTE — Progress Notes (Signed)
Patient ID: Erika [name], female   DOB: February 26, 1971, 45 y.o.   MRN: [id number]     Cardiology Office Note   Date:  11/29/2015   ID:  Erika [name] [birth year], MRN [id number]  PCP:  Worthy Rancher, MD  Cardiologist:   Jenkins Rouge, MD   Chief Complaint  Patient presents with  . Establish Care    had a syncopal episode in april with no concrete reason why, pe per      History of Present Illness: Erika [name] is a 46 y.o. female who presents for evaluation of chest pain and syncope Seen acutely in ER 4/17   She states that around 7 PM  On 4/17 she had an episode of 'burning" substernal chest pain. Pain lasted for approximately ten seconds and then she had a witnessed syncopal episode. Husband who witnessed the syncopal episode states that she loss consciousness for approximately one minute. She did not hit her head when she loss consciousness. Her husband states that her back hit the wall and then he grabbed her. Aside from generalized weakness patient felt like she was back to her normal self after she regained consciousness. No headache or confusion. Husband states that he did not witness any seizure like activity. Patient denies any chest pain or SOB when in ER . Denies dizziness, nausea, vomiting, vision changes, focal weakness, LE edema/pain, facial asymmetry, headache, numbness, tingling, or any other symptoms. She denies any prior cardiac history. No history of HTN, DM, or Hyperlipidemia. She does not smoke. No history of DVT or PE. She did  report that she  traveled to [place] a week before her ER visit  Denies exogenous estrogen use.  W/U in ER negative reviewed labs, CXR and ECGs All normal including negative enzymes, normal D dimer normal CXR and ECG   Appears to have always fainted easily.  Multiple previous episodes in settings that bothered her like pregnancy, daughter having her wisdom teach out  Past Medical History  Diagnosis Date  .  Headache(784.0)     history, otc med prn, last one 2 yrs ago  . Abnormal uterine bleeding   . Migraine with aura 02/12/2013  . Complicated migraine [id number]  . Stress     saw neurologist 6/15 for numbess in face and arm    Past Surgical History  Procedure Laterality Date  . Dilation and curettage of uterus    . Cesarean section      x 2  . Wisdom tooth extraction    . Tubal ligation    . Endometrial ablation      failed - done in MD office  . Cystoscopy  09/10/2011    Procedure: CYSTOSCOPY;  Surgeon: Felipa Emory, MD;  Location: [facility];  Service: Gynecology;  Laterality: N/A;  . Laparoscopic total hysterectomy      robotic     Current Outpatient Prescriptions  Medication Sig Dispense Refill  . Aspirin-Acetaminophen-Caffeine (EXCEDRIN MIGRAINE PO) Take 1 tablet by mouth daily as needed (for headaches).     Marland Kitchen ibuprofen (ADVIL,MOTRIN) 200 MG tablet Take 400 mg by mouth every 6 (six) hours as needed for headache, mild pain or moderate pain.      No current facility-administered medications for this visit.    Allergies:   Penicillins and Zomig    Social History:  The patient  reports that she has never smoked. She has never used smokeless tobacco. She reports that she drinks alcohol. She reports that  she does not use illicit drugs.   Family History:  The patient's family history includes Colon cancer (age of onset: 25) in her cousin; Colon cancer (age of onset: 4) in her maternal aunt; Diabetes in her maternal grandfather, paternal grandfather, and paternal grandmother; Gallbladder disease in her mother; Kidney Stones in her father.    ROS:  Please see the history of present illness.   Otherwise, review of systems are positive for none.   All other systems are reviewed and negative.    PHYSICAL EXAM: VS:  BP 90/80 mmHg  Pulse 92  Ht 5\' 3"  (1.6 m)  Wt 95.346 kg (210 lb 3.2 oz)  BMI 37.24 kg/m2  SpO2 98%  LMP 08/20/2011 , BMI Body mass index is 37.24 kg/(m^2). Affect  appropriate Healthy:  appears stated age 53: normal Neck supple with no adenopathy JVP normal no bruits no thyromegaly Lungs clear with no wheezing and good diaphragmatic motion Heart:  S1/S2 no murmur, no rub, gallop or click PMI normal Abdomen: benighn, BS positve, no tenderness, no AAA no bruit.  No HSM or HJR Distal pulses intact with no bruits No edema Neuro non-focal Skin warm and dry No muscular weakness    EKG:  11/03/15  SR rate 78 normal    Recent Labs: 11/02/2015: BUN 14; Creatinine, Ser 0.89; Hemoglobin 14.5; Platelets 324; Potassium 4.3; Sodium 138    Lipid Panel No results found for: CHOL, TRIG, HDL, CHOLHDL, VLDL, LDLCALC, LDLDIRECT    Wt Readings from Last 3 Encounters:  11/29/15 95.346 kg (210 lb 3.2 oz)  11/06/15 95.346 kg (210 lb 3.2 oz)  11/02/15 95.255 kg (210 lb)      Other studies Reviewed: Additional studies/ records that were reviewed today include:  ER records 11/03/15 notes, labs, CXR and ECG .    ASSESSMENT AND PLAN:  1.  Syncope:  Clinical history suggests simple faint In office today not postural and no POTTS.  She is not diabetic and has no other neurologic disease.  Exam, ECG and ER w/u all normal Will order ETT and echo to r/o structural heart disease and make sure she has normal HR/BP response to exercise Discussed importance of staying hydrated.     Current medicines are reviewed at length with the patient today.  The patient does not have concerns regarding medicines.  The following changes have been made:  no change  Labs/ tests ordered today include: ETT Echo   No orders of the defined types were placed in this encounter.     Disposition:   FU with me PRN      Signed, Jenkins Rouge, MD  11/29/2015 4:20 PM    [facility] [place], [place], [place]  [address] Phone: [phone]; Fax: [phone]

## 2015-11-29 ENCOUNTER — Ambulatory Visit (INDEPENDENT_AMBULATORY_CARE_PROVIDER_SITE_OTHER): Payer: BLUE CROSS/BLUE SHIELD | Admitting: Cardiovascular Disease

## 2015-11-29 ENCOUNTER — Encounter: Payer: Self-pay | Admitting: Cardiovascular Disease

## 2015-11-29 VITALS — BP 110/70 | HR 92 | Ht 63.0 in | Wt 210.2 lb

## 2015-11-29 DIAGNOSIS — Z7689 Persons encountering health services in other specified circumstances: Secondary | ICD-10-CM

## 2015-11-29 DIAGNOSIS — Z7189 Other specified counseling: Secondary | ICD-10-CM

## 2015-11-29 DIAGNOSIS — R55 Syncope and collapse: Secondary | ICD-10-CM | POA: Diagnosis not present

## 2015-11-29 NOTE — Patient Instructions (Addendum)
Medication Instructions:  Your physician recommends that you continue on your current medications as directed. Please refer to the Current Medication list given to you today.  Labwork: NONE  Testing/Procedures: Your physician has requested that you have an echocardiogram. Echocardiography is a painless test that uses sound waves to create images of your heart. It provides your doctor with information about the size and shape of your heart and how well your heart's chambers and valves are working. This procedure takes approximately one hour. There are no restrictions for this procedure.  Your physician has requested that you have an exercise tolerance test. For further information please visit [email or web address]. Please also follow instruction sheet, as given.  Follow-Up: Your physician wants you to follow-up as needed with Dr. Nishan.    If you need a refill on your cardiac medications before your next appointment, please call your pharmacy.    

## 2015-12-14 ENCOUNTER — Ambulatory Visit (HOSPITAL_COMMUNITY): Payer: BLUE CROSS/BLUE SHIELD | Attending: Cardiovascular Disease

## 2015-12-14 ENCOUNTER — Ambulatory Visit (INDEPENDENT_AMBULATORY_CARE_PROVIDER_SITE_OTHER): Payer: BLUE CROSS/BLUE SHIELD

## 2015-12-14 ENCOUNTER — Other Ambulatory Visit: Payer: Self-pay

## 2015-12-14 DIAGNOSIS — Z7689 Persons encountering health services in other specified circumstances: Secondary | ICD-10-CM

## 2015-12-14 DIAGNOSIS — Z7189 Other specified counseling: Secondary | ICD-10-CM

## 2015-12-14 DIAGNOSIS — R55 Syncope and collapse: Secondary | ICD-10-CM

## 2015-12-14 LAB — EXERCISE TOLERANCE TEST
CHL CUP MPHR: 178 {beats}/min
CHL CUP STRESS STAGE 1 GRADE: 0 %
CHL CUP STRESS STAGE 1 SPEED: 0 mph
CHL CUP STRESS STAGE 2 GRADE: 0 %
CHL CUP STRESS STAGE 2 HR: 94 {beats}/min
CHL CUP STRESS STAGE 3 GRADE: 0 %
CHL CUP STRESS STAGE 3 HR: 93 {beats}/min
CHL CUP STRESS STAGE 4 SBP: 144 mmHg
CHL CUP STRESS STAGE 5 GRADE: 12 %
CHL CUP STRESS STAGE 5 SPEED: 2.5 mph
CHL CUP STRESS STAGE 6 GRADE: 14 %
CHL CUP STRESS STAGE 6 HR: 169 {beats}/min
CHL CUP STRESS STAGE 7 GRADE: 14 %
CHL CUP STRESS STAGE 7 HR: 169 {beats}/min
CHL CUP STRESS STAGE 8 DBP: 75 mmHg
CHL CUP STRESS STAGE 8 SBP: 114 mmHg
CHL CUP STRESS STAGE 9 DBP: 78 mmHg
CHL CUP STRESS STAGE 9 SBP: 114 mmHg
CSEPED: 9 min
CSEPEDS: 0 s
CSEPEW: 10.1 METS
CSEPPHR: 169 {beats}/min
Percent HR: 94 %
Percent of predicted max HR: 94 %
RPE: 94
Rest HR: 75 {beats}/min
Stage 1 DBP: 81 mmHg
Stage 1 HR: 85 {beats}/min
Stage 1 SBP: 117 mmHg
Stage 2 Speed: 0.6 mph
Stage 3 Speed: 1 mph
Stage 4 DBP: 85 mmHg
Stage 4 Grade: 10 %
Stage 4 HR: 122 {beats}/min
Stage 4 Speed: 1.7 mph
Stage 5 HR: 130 {beats}/min
Stage 6 Speed: 3.4 mph
Stage 7 Speed: 3.4 mph
Stage 8 Grade: 0 %
Stage 8 HR: 142 {beats}/min
Stage 8 Speed: 0 mph
Stage 9 Grade: 0 %
Stage 9 HR: 100 {beats}/min
Stage 9 Speed: 0 mph

## 2016-01-16 ENCOUNTER — Encounter: Payer: Self-pay | Admitting: Family Medicine

## 2016-01-16 ENCOUNTER — Encounter: Payer: Self-pay | Admitting: Cardiovascular Disease

## 2016-01-17 ENCOUNTER — Encounter: Payer: Self-pay | Admitting: Obstetrics & Gynecology

## 2016-01-17 ENCOUNTER — Other Ambulatory Visit: Payer: Self-pay | Admitting: Obstetrics & Gynecology

## 2016-01-17 DIAGNOSIS — R55 Syncope and collapse: Secondary | ICD-10-CM

## 2016-01-18 ENCOUNTER — Other Ambulatory Visit: Payer: Self-pay | Admitting: Family Medicine

## 2016-01-18 DIAGNOSIS — R002 Palpitations: Secondary | ICD-10-CM

## 2016-01-22 ENCOUNTER — Institutional Professional Consult (permissible substitution): Payer: BLUE CROSS/BLUE SHIELD | Admitting: Internal Medicine

## 2016-01-25 ENCOUNTER — Encounter: Payer: Self-pay | Admitting: Neurology

## 2016-01-25 ENCOUNTER — Ambulatory Visit (INDEPENDENT_AMBULATORY_CARE_PROVIDER_SITE_OTHER): Payer: BLUE CROSS/BLUE SHIELD | Admitting: Neurology

## 2016-01-25 VITALS — BP 116/72 | HR 82 | Resp 16 | Ht 63.0 in | Wt 212.0 lb

## 2016-01-25 DIAGNOSIS — Z8659 Personal history of other mental and behavioral disorders: Secondary | ICD-10-CM

## 2016-01-25 DIAGNOSIS — R55 Syncope and collapse: Secondary | ICD-10-CM

## 2016-01-25 DIAGNOSIS — R6889 Other general symptoms and signs: Secondary | ICD-10-CM | POA: Diagnosis not present

## 2016-01-25 NOTE — Patient Instructions (Signed)
We will do a brain scan, called MRI and call you with the test results. We will have to schedule you for this on a separate date. This test requires authorization from your insurance, and we will take care of the insurance process. We will do an EEG (brainwave test), which we will schedule. We will call you with the results.  It really sounds like you have had 2 syncopal spells.   Please remember, common syncope triggers are: Sleep deprivation, dehydration, overheating, stress, hypoglycemia or skipping meals, certain medications or excessive alcohol use. Avoid these triggers as much as you can.   Vasovagal syncope is one of the most common causes of fainting. Vasovagal syncope occurs when your body overreacts to certain triggers, such as the sight of blood or extreme emotional distress or overheating.  The vasovagal syncope trigger causes a sudden drop in your heart rate and blood pressure, which leads to reduced blood flow to your brain, which results in a brief loss of consciousness. Vasovagal syncope is usually harmless and requires no treatment. However, if you collapse and fall, it is possible you may injure yourself.  Pre-sycopal symptoms include (but are not limited to): Skin paleness, lightheadedness, tunnel vision, nausea, a rising feeling of warmth, feeling cold and clammy, excess yawning, and blurry vision. You may have jerky, abnormal movements, a slow and weak pulse and dilated pupils and you may have some confusion and mental slowing when you come to.  Common triggers for vasovagal syncope include (but are not limited to): Standing for long periods of time, heat exposure, the sight of blood or having blood drawn, fear and straining, such as during a bowel movement.  There is no specific medication for treatment of fainting. Sometimes we use medications to keep the blood pressure elevated but this is rare. Supportive treatment includes foot exercises, wearing compression stockings or tensing  your leg muscles when standing, increasing salt in your diet (unless you have high blood pressure). Avoid prolonged standing - especially in hot, crowded places - and drink plenty of fluids and change position from sitting to standing or lying to standing slowly and avoid overheating.

## 2016-01-25 NOTE — Progress Notes (Signed)
Subjective:    Patient ID: Erika [name] is a 45 y.o. female.  HPI     Star Age, MD, PhD [facility] [address], [address] [place], [place] [address]  Dear Dr. Sabra Heck,   I saw your patient, Erika [name], upon your kind request in my neurologic clinic today for initial consultation of her syncopal spells. The patient is unaccompanied today. As you know, Erika [name] is a 45 year old right-handed woman with an underlying medical history of recurrent headaches, DUB, and obesity who reports recurrent syncopal spells. She has seen cardiology, Dr. Johnsie Cancel on 11/29/2015 and I reviewed his office note. She was seen in the emergency room for similar issues on 11/06/2015 and I reviewed the emergency room records. I have previously seen her for dizziness in 2015 and migraine headaches in 2014. She has also seen my colleague, Dr. Leonie Man in 2014 as well.  Workup for syncopal spells thus far include echocardiogram from 12/14/2015 which showed EF of 55-60%, normal wall motion, and other parameters normal. Exercise tolerance test from 12/14/2015 showed normal findings with a peak heart rate of 169, no ST or T-wave changes and no symptoms. She reports 2 passing out spells that last a few seconds at a time. She may have a warning of lightheadedness right before the first one. Both times it happened in her husband's truck. First time happened in April, husband noted some twitching of the L arm, but no general convulsions, no tongue bite, no incontinence. Second time happened about 2 weeks ago and her husband was not even but r aware at the time that she had passed out, it was brief, no twitching, but had no warning of lightheadedness and woke up again with her arm tingling. Both times she had brief arm tingling on the left. In between she has been fine. She cannot think of any triggers, no additional stress, tries to sleep well enough, he is however up at least 2 times with the dogs at  night, no recent illness no headaches. Felt drained for days, worse the first time around, 2 days the second time around.  She had a brain MRI without contrast on 02/25/2013:  IMPRESSION:   Normal MRI brain (without).  Previously:   01/31/2014: I first met her on 02/12/2013, at which time she reported a several year history of migraine headaches with recent episode of migraine headache associated with speech impairment and lip numbness. I felt she had a recent episode of complicated migraine. We did further workup in the form of brain MRI without contrast which was done on 02/25/2013 and reported as normal. She also had a consultation with Dr. Leonie Man on 03/10/2013 for a bubble study to look for PFO. 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 Results were explained to the patient. Questions were answered by Dr. Leonie Man and he felt, that a trivial small shunt is unlikely to be of clinical significance and does not merit further evaluation or treatment considerations. She presents with a new history of numbness in her upper extremities complaints of dizziness and a fall in May of this year. She also has a complaint of tremors in her hands for the past 2 or 3 months. She was seen by her primary care provider on 01/05/2014 and had some blood work. She was advised to stop caffeine. She reports a host of other problems including tingling of her arms and legs, weakness in her  arms and legs, fatigue, feet and her legs and feet, 10. Hearing loss, blurry vision, dizziness, forgetfulness, trouble finding words, falling, dropping things. Her biggest complaint is her tremors, which started in March 2015 and she noted it after she took a flight, and she did have some anxiety during the flight, which had been only her second time flying and she has had an intermittent tremor ever since. She notices it when she holds something, no rest tremor, no FHx of  tremor.   Her arm numbness is mostly on the L, but also on the R, off and on, no pattern, from the elbows down, most typically when she is outside in the heat.   She has not had a headache in months. She has not noted a difference when she stopped. She does report stressors, especially noticing symptoms, when she had issues with her mother in-law; since her father in-law passed away, there has been quite a bit of friction. Her husband tries to help. They have been married 20 years. They have a 43 year old and an 43 year old.   She had blood work on 01/05/14 with her PCP: CBC, arthritis panel, TSH, CMP, with results unknown to patient.   She has arthritis symptoms in her R thumb. Her knees hurt when going down stairs.   She had her eyes checked a year ago. Obesity and unaccompanied today.  Her Past Medical History Is Significant For: Past Medical History  Diagnosis Date  . Headache(784.0)     history, otc med prn, last one 2 yrs ago  . Abnormal uterine bleeding   . Migraine with aura 02/12/2013  . Complicated migraine 9/62/2297  . Stress     saw neurologist 6/15 for numbess in face and arm    Her Past Surgical History Is Significant For: Past Surgical History  Procedure Laterality Date  . Dilation and curettage of uterus    . Cesarean section      x 2  . Wisdom tooth extraction    . Tubal ligation    . Endometrial ablation      failed - done in MD office  . Cystoscopy  09/10/2011    Procedure: CYSTOSCOPY;  Surgeon: Felipa Emory, MD;  Location: [facility];  Service: Gynecology;  Laterality: N/A;  . Laparoscopic total hysterectomy      robotic    Her Family History Is Significant For: Family History  Problem Relation Age of Onset  . Diabetes Maternal Grandfather   . Lung cancer Maternal Grandfather   . Diabetes Paternal Grandmother   . Diabetes Paternal Grandfather   . Kidney Stones Father   . Gallbladder disease Mother   . Colon cancer Maternal Aunt 84  . Colon cancer Cousin  86    paternal cousin    Her Social History Is Significant For: Social History   Social History  . Marital Status: Married    Spouse Name: N/A  . Number of Children: 2  . Years of Education: HS   Social History Main Topics  . Smoking status: Never Smoker   . Smokeless tobacco: Never Used  . Alcohol Use: 0.0 oz/week    0 Standard drinks or equivalent per week     Comment: occ  . Drug Use: No  . Sexual Activity:    Partners: Male    Birth Control/ Protection: Surgical     Comment: robotic TLH   Other Topics Concern  . None   Social History Narrative   Caffeine: 1-2 drinks a week  Her Allergies Are:  Allergies  Allergen Reactions  . Penicillins Rash  . Zomig [Zolmitriptan] Rash  :   Her Current Medications Are:  Outpatient Encounter Prescriptions as of 01/25/2016  Medication Sig  . Aspirin-Acetaminophen-Caffeine (EXCEDRIN MIGRAINE PO) Take 1 tablet by mouth daily as needed (for headaches).   Marland Kitchen ibuprofen (ADVIL,MOTRIN) 200 MG tablet Take 400 mg by mouth every 6 (six) hours as needed for headache, mild pain or moderate pain.    No facility-administered encounter medications on file as of 01/25/2016.  :   Review of Systems:  Out of a complete 14 point review of systems, all are reviewed and negative with the exception of these symptoms as listed below:   Review of Systems  Neurological:       Patient states that she has h/o syncope. She says that she usually gets a warning before it happens like dizziness or light headedness, but recently she gets no warning before it happens.  Last 2 events occurred in April and 2 weeks ago. When she wakes, her arms tingle, has confusion, and has fatigue for the next couple of days.  Hot flashes, tremors     Objective:  Neurologic Exam  Physical Exam Physical Examination:   Filed Vitals:   01/25/16 0903 01/25/16 0910  BP: 117/66 116/72  Pulse: 77 82  Resp: 16 16   Sitting 117/66, pulse of 77, standing 116/72 with a  pulse of 82. No orthostatic symptoms are reported, appropriate response is noted.  General Examination: The patient is a very pleasant 45 y.o. female in no acute distress. She appears well-developed and well-nourished and well groomed.   HEENT: Negative dix-hallpike. Normocephalic, atraumatic, pupils are equal, round and reactive to light and accommodation. Funduscopic exam is normal with sharp disc margins noted. Extraocular tracking is good without limitation to gaze excursion or nystagmus noted. Normal smooth pursuit is noted. Hearing is grossly intact. Tympanic membranes are clear bilaterally. Face is symmetric with normal facial animation and normal facial sensation. Speech is clear with no dysarthria noted. There is no hypophonia. There is no lip, neck/head, jaw or voice tremor. Neck is supple with full range of passive and active motion. There are no carotid bruits on auscultation. Oropharynx exam reveals: mild mouth dryness, good dental hygiene  Chest: Clear to auscultation without wheezing, rhonchi or crackles noted.  Heart: S1+S2+0, regular and normal without murmurs, rubs or gallops noted.   Abdomen: Soft, non-tender and non-distended with normal bowel sounds appreciated on auscultation.  Extremities: There is no pitting edema in the distal lower extremities bilaterally. Pedal pulses are intact.  Skin: Warm and dry without trophic changes noted. There are no varicose veins.  Musculoskeletal: exam reveals no obvious joint deformities, tenderness or joint swelling or erythema.   Neurologically:  Mental status: The patient is awake, alert and oriented in all 4 spheres. Her immediate and remote memory, attention, language skills and fund of knowledge are appropriate. There is no evidence of aphasia, agnosia, apraxia or anomia. Speech is clear with normal prosody and enunciation. Thought process is linear. Mood is normal and affect is normal.  Cranial nerves II - XII are as described above  under HEENT exam. In addition: shoulder shrug is normal with equal shoulder height noted. Motor exam: Normal bulk, strength and tone is noted. There is no drift, tremor or rebound. Romberg is negative. Reflexes are 2+ throughout. Babinski: Toes are flexor bilaterally. Fine motor skills and coordination: intact with normal finger taps, normal hand movements, normal rapid  alternating patting, normal foot taps and normal foot agility.  Cerebellar testing: No dysmetria or intention tremor on finger to nose testing. Heel to shin is unremarkable bilaterally. There is no truncal or gait ataxia.  Sensory exam: intact to light touch, pinprick, vibration, temperature sense in the upper and lower extremities.  Gait, station and balance: She stands easily. No veering to one side is noted. No leaning to one side is noted. Posture is age-appropriate and stance is narrow based. Gait shows normal stride length and normal pace. No problems turning are noted. Tandem walk is unremarkable.   Assessment and Plan:   In summary, Erika [name] is a very pleasant 45 year old female with an underlying medical history of recurrent headaches, DUB, Status post hysterectomy in 2013, and obesity who reports two syncopal spells in the last 3 months. The first time she had mild warning of lightheadedness and not feeling well, the second time around which was more brief, she had no warning, both times she woke up with mild confusion and felt fatigued and drained 4 days, worse overall first time around had brief tingling of her left arm both times. She had cardiac workup in the form of echocardiogram and ETT recently with benign findings which we reviewed. Her neurological exam is completely nonfocal and she is reassured in that regard. Her physical exam is also nonfocal, negative Dix-Hallpike, negative orthostatic vital signs. Her history is most suggestive of syncope, not typical for seizures, not in keeping with migraine or TIA or transient  global amnesia which the latter 2 are very unlikely given her benign history, her young age, her absent vascular risk factors, etc. Nevertheless, from my end of things, I suggested we do a brain MRI with and without contrast and an EEG. She is advised to change positions slowly, keep well hydrated, well rested, avoid stress, and continue conversation with her cardiologists as well. She has an appointment with Dr. Caryl Comes pending next week which I think is a good idea. Further workup may include tilt table testing, long-term blood pressure monitor, a long term heart monitor etc., even implantable loop monitor as can be considered but at this time because her events were so she went far between, she may not be a reasonable candidate for any additional monitor but she is encouraged to keep up the discussion with her cardiology specialists. We will be in touch with her as far as her scan results and EEG. I did not suggest any medication changes. Of note, she did report increasing hot flashes recently in the past 6 months. She is encouraged to discuss this with you as well. I will see her back routinely after these tests are completed in a couple of months, sooner as needed. She is encouraged to email or call with any interim questions or concerns. I answered all her questions today and she was in agreement.  Thank you very much for allowing me to participate in the care of this nice lady. If I can be of any further assistance to you, these do not hesitate to call me at [phone],  Sincerely,   Star Age, MD, PhD [facility] ([place])

## 2016-02-01 ENCOUNTER — Encounter: Payer: Self-pay | Admitting: *Deleted

## 2016-02-01 ENCOUNTER — Encounter: Payer: Self-pay | Admitting: Internal Medicine

## 2016-02-01 ENCOUNTER — Ambulatory Visit (INDEPENDENT_AMBULATORY_CARE_PROVIDER_SITE_OTHER): Payer: BLUE CROSS/BLUE SHIELD | Admitting: Internal Medicine

## 2016-02-01 VITALS — BP 116/78 | HR 72 | Ht 63.0 in | Wt 214.4 lb

## 2016-02-01 DIAGNOSIS — R55 Syncope and collapse: Secondary | ICD-10-CM

## 2016-02-01 NOTE — Patient Instructions (Signed)
Medication Instructions: - Your physician recommends that you continue on your current medications as directed. Please refer to the Current Medication list given to you today.  Labwork: - none  Procedures/Testing: - Your physician has recommended that you have an Implantable Loop recorder (LINQ) placed.  Follow-Up: - Your physician recommends that you schedule a follow-up appointment in: 10-14 days (from 7/19)   Any Additional Special Instructions Will Be Listed Below (If Applicable).     If you need a refill on your cardiac medications before your next appointment, please call your pharmacy.
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
## 2016-02-05 ENCOUNTER — Telehealth: Payer: Self-pay | Admitting: Internal Medicine

## 2016-02-05 NOTE — Telephone Encounter (Signed)
Follow Up:    Pt said she sent an e mail to Dr Caryl Comes last week stating she needs a letter for work.She have not heard anything, she would like to know the status of the letter.

## 2016-02-05 NOTE — Telephone Encounter (Signed)
PT NEEDS   A LETTER  TO RETURN  TO WORK . MAY WORK  FROM HOME  IF  HAS LETTER STATING  CANNOT  DRIVE  FOR  6 MONTHS  WILL FORWARD TO  DR Mannington RN./CY

## 2016-02-06 ENCOUNTER — Encounter: Payer: Self-pay | Admitting: Internal Medicine

## 2016-02-06 NOTE — Telephone Encounter (Signed)
I left a message for the patient that Dr. Caryl Comes will have what she has requested when he sees her in the morning for her LINQ implant.

## 2016-02-07 ENCOUNTER — Encounter (HOSPITAL_COMMUNITY)
Admission: RE | Disposition: A | Discharge status: Home / Self Care | Payer: Self-pay | Source: Ambulatory Visit | Attending: Internal Medicine

## 2016-02-07 ENCOUNTER — Ambulatory Visit (HOSPITAL_COMMUNITY)
Admission: RE | Admit: 2016-02-07 | Discharge: 2016-02-07 | Disposition: A | Discharge status: Home / Self Care | Payer: BLUE CROSS/BLUE SHIELD | Source: Ambulatory Visit | Attending: Internal Medicine | Admitting: Internal Medicine

## 2016-02-07 ENCOUNTER — Encounter (HOSPITAL_COMMUNITY): Payer: Self-pay | Admitting: Internal Medicine

## 2016-02-07 DIAGNOSIS — I639 Cerebral infarction, unspecified: Secondary | ICD-10-CM | POA: Diagnosis not present

## 2016-02-07 DIAGNOSIS — Z801 Family history of malignant neoplasm of trachea, bronchus and lung: Secondary | ICD-10-CM | POA: Diagnosis not present

## 2016-02-07 DIAGNOSIS — R55 Syncope and collapse: Secondary | ICD-10-CM | POA: Insufficient documentation

## 2016-02-07 DIAGNOSIS — Z800 Family history of malignant neoplasm of digestive organs: Secondary | ICD-10-CM | POA: Diagnosis not present

## 2016-02-07 DIAGNOSIS — R61 Generalized hyperhidrosis: Secondary | ICD-10-CM | POA: Diagnosis not present

## 2016-02-07 DIAGNOSIS — Z833 Family history of diabetes mellitus: Secondary | ICD-10-CM | POA: Diagnosis not present

## 2016-02-07 DIAGNOSIS — G43109 Migraine with aura, not intractable, without status migrainosus: Secondary | ICD-10-CM | POA: Diagnosis not present

## 2016-02-07 DIAGNOSIS — Z880 Allergy status to penicillin: Secondary | ICD-10-CM | POA: Diagnosis not present

## 2016-02-07 HISTORY — PX: EP IMPLANTABLE DEVICE: SHX172B

## 2016-02-07 SURGERY — LOOP RECORDER INSERTION
Anesthesia: LOCAL

## 2016-02-07 MED ORDER — LIDOCAINE-EPINEPHRINE 1 %-1:100000 IJ SOLN
INTRAMUSCULAR | Status: AC
  Filled 2016-02-07: qty 1

## 2016-02-07 MED ORDER — LIDOCAINE HCL (PF) 1 % IJ SOLN
INTRAMUSCULAR | Status: DC | PRN
  Administered 2016-02-07: 25 mL via INTRADERMAL

## 2016-02-07 SURGICAL SUPPLY — 2 items
LOOP REVEAL LINQSYS (Prosthesis & Implant Heart) ×1 IMPLANT
PACK LOOP INSERTION (CUSTOM PROCEDURE TRAY) ×2 IMPLANT
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
## 2016-02-07 NOTE — Interval H&P Note (Signed)
History and Physical Interval Note:  02/07/2016 8:53 AM  Erika [name]  has presented today for surgery, with the diagnosis of syncope  The various methods of treatment have been discussed with the patient and family. After consideration of risks, benefits and other options for treatment, the patient has consented to  Procedure(s): Loop Recorder Insertion (N/A) as a surgical intervention .  The patient's history has been reviewed, patient examined, no change in status, stable for surgery.  I have reviewed the patient's chart and labs.  Questions were answered to the patient's satisfaction.     Virl Axe

## 2016-02-07 NOTE — Discharge Instructions (Signed)
See printed supplemental discharge instruction sheet.

## 2016-02-21 ENCOUNTER — Ambulatory Visit (INDEPENDENT_AMBULATORY_CARE_PROVIDER_SITE_OTHER): Payer: BLUE CROSS/BLUE SHIELD | Admitting: *Deleted

## 2016-02-21 ENCOUNTER — Encounter: Payer: Self-pay | Admitting: Internal Medicine

## 2016-02-21 DIAGNOSIS — R55 Syncope and collapse: Secondary | ICD-10-CM

## 2016-02-21 LAB — CUP PACEART INCLINIC DEVICE CHECK: MDC IDC SESS DTM: 20170802091922

## 2016-02-21 NOTE — Progress Notes (Signed)
Wound check. Steri-Strips removed by patient. Incision well healed. Loop check in clinic. Battery status: Good. R-waves 1.60mV. 0 symptom episodes, 0 tachy episodes, 0 pause episodes, 0 brady episodes. 0 AF episode. Monthly summary reports and ROV with SK 05/22/2016.

## 2016-02-27 ENCOUNTER — Ambulatory Visit (INDEPENDENT_AMBULATORY_CARE_PROVIDER_SITE_OTHER): Payer: BLUE CROSS/BLUE SHIELD

## 2016-02-27 DIAGNOSIS — R6889 Other general symptoms and signs: Secondary | ICD-10-CM

## 2016-02-27 DIAGNOSIS — R55 Syncope and collapse: Secondary | ICD-10-CM

## 2016-02-27 DIAGNOSIS — Z8659 Personal history of other mental and behavioral disorders: Secondary | ICD-10-CM

## 2016-02-27 NOTE — Procedures (Signed)
    History:  Erika [name] is a 45 year old patient with a history of recurrent headaches and obesity who has also had recurring syncopal events. The episodes of loss of consciousness may last a few seconds at each time associated with lightheadedness preceding the loss of consciousness. The patient has been noted to have some twitching of the left arm, but no generalized convulsions. The patient is being evaluated for possible seizures.  This is a routine EEG. No skull defects are noted. Medications include Excedrin Migraine and ibuprofen.   EEG classification: Normal awake  Description of the recording: The background rhythms of this recording consists of a fairly well modulated medium amplitude alpha rhythm of 10 Hz that is reactive to eye opening and closure. As the record progresses, the patient appears to remain in the waking state throughout the recording. Photic stimulation was performed, resulting in a bilateral and symmetric photic driving response. Hyperventilation was also performed, resulting in a minimal buildup of the background rhythm activities without significant slowing seen. At no time during the recording does there appear to be evidence of spike or spike wave discharges or evidence of focal slowing. EKG monitor shows no evidence of cardiac rhythm abnormalities with a heart rate of 72.  Impression: This is a normal EEG recording in the waking state. No evidence of ictal or interictal discharges are seen.

## 2016-02-28 ENCOUNTER — Telehealth: Payer: Self-pay

## 2016-02-28 NOTE — Telephone Encounter (Signed)
-----   Message from Star Age, MD sent at 02/28/2016  2:42 PM EDT ----- Please call and advise the patient that the EEG or brain wave test we performed was reported as normal in the awake state. We checked for abnormal electrical discharges in the brain waves and the report suggested normal findings. No further action is required on this test at this time. Please remind patient to keep any upcoming appointments or tests and to call us with any interim questions, concerns, problems or updates. Thanks,  Star Age, MD, PhD

## 2016-02-28 NOTE — Progress Notes (Signed)
Please call and advise the patient that the EEG or brain wave test we performed was reported as normal in the awake state. We checked for abnormal electrical discharges in the brain waves and the report suggested normal findings. No further action is required on this test at this time. Please remind patient to keep any upcoming appointments or tests and to call us with any interim questions, concerns, problems or updates. Thanks,  Carver Murakami, MD, PhD  

## 2016-02-28 NOTE — Telephone Encounter (Signed)
I spoke to patient and she is aware of results and recommendations.  

## 2016-03-08 ENCOUNTER — Ambulatory Visit (INDEPENDENT_AMBULATORY_CARE_PROVIDER_SITE_OTHER): Payer: BLUE CROSS/BLUE SHIELD | Admitting: *Deleted

## 2016-03-08 DIAGNOSIS — R55 Syncope and collapse: Secondary | ICD-10-CM | POA: Diagnosis not present

## 2016-03-08 NOTE — Progress Notes (Signed)
Carelink Summary Report / Loop Recorder 

## 2016-03-26 ENCOUNTER — Encounter: Payer: Self-pay | Admitting: Internal Medicine

## 2016-04-03 LAB — CUP PACEART REMOTE DEVICE CHECK: MDC IDC SESS DTM: 20170818120517

## 2016-04-04 ENCOUNTER — Other Ambulatory Visit: Payer: Self-pay | Admitting: Neurology

## 2016-04-04 ENCOUNTER — Ambulatory Visit
Admission: RE | Admit: 2016-04-04 | Discharge: 2016-04-04 | Disposition: A | Discharge status: Home / Self Care | Payer: BLUE CROSS/BLUE SHIELD | Source: Ambulatory Visit | Attending: Neurology | Admitting: Neurology

## 2016-04-04 DIAGNOSIS — Z8659 Personal history of other mental and behavioral disorders: Secondary | ICD-10-CM

## 2016-04-04 DIAGNOSIS — R6889 Other general symptoms and signs: Secondary | ICD-10-CM

## 2016-04-04 DIAGNOSIS — R55 Syncope and collapse: Secondary | ICD-10-CM

## 2016-04-08 ENCOUNTER — Telehealth: Payer: Self-pay

## 2016-04-08 ENCOUNTER — Ambulatory Visit (INDEPENDENT_AMBULATORY_CARE_PROVIDER_SITE_OTHER): Payer: BLUE CROSS/BLUE SHIELD | Admitting: *Deleted

## 2016-04-08 DIAGNOSIS — R55 Syncope and collapse: Secondary | ICD-10-CM | POA: Diagnosis not present

## 2016-04-08 NOTE — Progress Notes (Signed)
Carelink Summary Report / Loop Recorder 

## 2016-04-08 NOTE — Telephone Encounter (Signed)
Lm for patient to call back for results.  

## 2016-04-08 NOTE — Progress Notes (Signed)
Non specific white spots on brain MRI wo contrast, no lesions, strokes, atrophy.  No further action required, please let patient know.  Star Age, MD, PhD [facility])

## 2016-04-08 NOTE — Telephone Encounter (Signed)
-----   Message from Star Age, MD sent at 04/08/2016  8:47 AM EDT ----- Non specific white spots on brain MRI wo contrast, no lesions, strokes, atrophy.  No further action required, please let patient know.  Star Age, MD, PhD [facility])

## 2016-04-09 NOTE — Telephone Encounter (Signed)
LM (per DPR) with results below.  

## 2016-04-15 ENCOUNTER — Telehealth: Payer: Self-pay | Admitting: *Deleted

## 2016-04-15 NOTE — Telephone Encounter (Signed)
Patient called back today to verify MRI results. I went over the results with her and she voiced understanding.

## 2016-04-29 ENCOUNTER — Ambulatory Visit: Payer: BLUE CROSS/BLUE SHIELD | Admitting: Neurology

## 2016-05-02 LAB — CUP PACEART REMOTE DEVICE CHECK: MDC IDC SESS DTM: 20170917120536

## 2016-05-02 NOTE — Progress Notes (Signed)
Carelink summary report received. Battery status OK. Normal device function. No new symptom episodes, tachy episodes, brady, or pause episodes. No new AF episodes. Monthly summary reports and ROV/PRN 

## 2016-05-07 ENCOUNTER — Ambulatory Visit (INDEPENDENT_AMBULATORY_CARE_PROVIDER_SITE_OTHER): Payer: BLUE CROSS/BLUE SHIELD | Admitting: *Deleted

## 2016-05-07 DIAGNOSIS — R55 Syncope and collapse: Secondary | ICD-10-CM | POA: Diagnosis not present

## 2016-05-07 NOTE — Progress Notes (Signed)
Carelink Summary Report / Loop Recorder 

## 2016-05-22 ENCOUNTER — Encounter: Payer: Self-pay | Admitting: Internal Medicine

## 2016-05-22 ENCOUNTER — Ambulatory Visit (INDEPENDENT_AMBULATORY_CARE_PROVIDER_SITE_OTHER): Payer: BLUE CROSS/BLUE SHIELD | Admitting: Internal Medicine

## 2016-05-22 VITALS — BP 140/84 | HR 83 | Ht 63.0 in | Wt 217.0 lb

## 2016-05-22 DIAGNOSIS — R55 Syncope and collapse: Secondary | ICD-10-CM

## 2016-05-22 NOTE — Patient Instructions (Signed)
Medication Instructions: - Your physician recommends that you continue on your current medications as directed. Please refer to the Current Medication list given to you today.  Labwork: - none ordered  Procedures/Testing: - none ordered  Follow-Up: - Your physician wants you to follow-up in: 9 months with Dr. Klein. You will receive a reminder letter in the mail two months in advance. If you don't receive a letter, please call our office to schedule the follow-up appointment.   Any Additional Special Instructions Will Be Listed Below (If Applicable).     If you need a refill on your cardiac medications before your next appointment, please call your pharmacy.   

## 2016-05-22 NOTE — Progress Notes (Signed)
      Patient Care Team: Worthy Rancher, MD as PCP - General (Family Medicine)   HPI  Erika [name] is a 45 y.o. female Seen in followup for syncope and ILR implantation 7/17  shes had no intercurrent syncope  Records and Results Reviewed  Past Medical History:  Diagnosis Date  . Abnormal uterine bleeding   . Complicated migraine A999333  . Headache(784.0)    history, otc med prn, last one 2 yrs ago  . Migraine with aura 02/12/2013  . Stress    saw neurologist 6/15 for numbess in face and arm    Past Surgical History:  Procedure Laterality Date  . CESAREAN SECTION     x 2  . CYSTOSCOPY  09/10/2011   Procedure: CYSTOSCOPY;  Surgeon: Felipa Emory, MD;  Location: [facility];  Service: Gynecology;  Laterality: N/A;  . DILATION AND CURETTAGE OF UTERUS    . ENDOMETRIAL ABLATION     failed - done in MD office  . EP IMPLANTABLE DEVICE N/A 02/07/2016   Procedure: Loop Recorder Insertion;  Surgeon: Deboraha Sprang, MD;  Location: [facility];  Service: Cardiovascular;  Laterality: N/A;  . LAPAROSCOPIC TOTAL HYSTERECTOMY     robotic  . TUBAL LIGATION    . WISDOM TOOTH EXTRACTION      Current Outpatient Prescriptions  Medication Sig Dispense Refill  . Probiotic Product (PROBIOTIC DAILY PO) Take 1 capsule by mouth daily.     No current facility-administered medications for this visit.     Allergies  Allergen Reactions  . Penicillins Rash    Has patient had a PCN reaction causing immediate rash, facial/tongue/throat swelling, SOB or lightheadedness with hypotension: Yes Has patient had a PCN reaction causing severe rash involving mucus membranes or skin necrosis: No Has patient had a PCN reaction that required hospitalization No Has patient had a PCN reaction occurring within the last 10 years: Yes If all of the above answers are "NO", then may proceed with Cephalosporin use.   . Zomig [Zolmitriptan] Rash      Review of Systems negative except from HPI and  PMH  Physical Exam BP 140/84   Pulse 83   Ht 5\' 3"  (1.6 m)   Wt 217 lb (98.4 kg)   LMP 08/20/2011 (Exact Date)   SpO2 98%   BMI 38.44 kg/m  Well developed and well nourished in no acute distress HENT normal E scleral and icterus clear Neck Supple JVP flat; carotids brisk and full Clear to ausculation  Regular rate and rhythm, no murmurs gallops or rub Soft with active bowel sounds No clubbing cyanosis  Edema Alert and oriented, grossly normal motor and sensory function Skin Warm and Dry  Sinus at 68 Intervals 14/09/40 Otherwise normal  Assessment and  Plan  Syncope  Implantable loop recorder   No interval syncope. No arrhythmias noted on her device   Current medicines are reviewed at length with the patient today .  The patient does not  have concerns regarding medicines.

## 2016-05-23 LAB — CUP PACEART INCLINIC DEVICE CHECK
Implantable Pulse Generator Implant Date: 20170719
MDC IDC SESS DTM: 20171102111140

## 2016-06-06 ENCOUNTER — Ambulatory Visit (INDEPENDENT_AMBULATORY_CARE_PROVIDER_SITE_OTHER): Payer: BLUE CROSS/BLUE SHIELD | Admitting: *Deleted

## 2016-06-06 DIAGNOSIS — R55 Syncope and collapse: Secondary | ICD-10-CM | POA: Diagnosis not present

## 2016-06-06 NOTE — Progress Notes (Signed)
Carelink Summary Report / Loop Recorder 

## 2016-06-08 LAB — CUP PACEART REMOTE DEVICE CHECK
Date Time Interrogation Session: 20171017123538
MDC IDC PG IMPLANT DT: 20170719

## 2016-06-08 NOTE — Progress Notes (Signed)
Carelink summary report received. Battery status OK. Normal device function. No new symptom episodes, tachy episodes, brady, or pause episodes. No new AF episodes. Monthly summary reports and ROV/PRN 

## 2016-06-26 ENCOUNTER — Encounter: Payer: Self-pay | Admitting: Family Medicine

## 2016-06-26 MED ORDER — SCOPOLAMINE 1 MG/3DAYS TD PT72: 1.0000 | MEDICATED_PATCH | TRANSDERMAL | 12 refills | Status: DC

## 2016-07-08 ENCOUNTER — Ambulatory Visit (INDEPENDENT_AMBULATORY_CARE_PROVIDER_SITE_OTHER): Payer: BLUE CROSS/BLUE SHIELD | Admitting: *Deleted

## 2016-07-08 DIAGNOSIS — R55 Syncope and collapse: Secondary | ICD-10-CM

## 2016-07-08 NOTE — Progress Notes (Signed)
Carelink Summary Report / Loop Recorder 

## 2016-07-18 LAB — CUP PACEART REMOTE DEVICE CHECK
Date Time Interrogation Session: 20171116123808
Implantable Pulse Generator Implant Date: 20170719

## 2016-07-23 ENCOUNTER — Other Ambulatory Visit: Payer: Self-pay | Admitting: Obstetrics & Gynecology

## 2016-07-23 DIAGNOSIS — Z1231 Encounter for screening mammogram for malignant neoplasm of breast: Secondary | ICD-10-CM

## 2016-07-27 DIAGNOSIS — J069 Acute upper respiratory infection, unspecified: Secondary | ICD-10-CM | POA: Diagnosis not present

## 2016-07-27 DIAGNOSIS — R05 Cough: Secondary | ICD-10-CM | POA: Diagnosis not present

## 2016-08-05 ENCOUNTER — Ambulatory Visit (INDEPENDENT_AMBULATORY_CARE_PROVIDER_SITE_OTHER): Payer: BLUE CROSS/BLUE SHIELD | Admitting: *Deleted

## 2016-08-05 DIAGNOSIS — R55 Syncope and collapse: Secondary | ICD-10-CM | POA: Diagnosis not present

## 2016-08-05 NOTE — Progress Notes (Signed)
Carelink Summary Report / Loop Recorder 

## 2016-08-19 ENCOUNTER — Encounter: Payer: Self-pay | Admitting: Internal Medicine

## 2016-08-19 ENCOUNTER — Telehealth: Payer: Self-pay | Admitting: Cardiology

## 2016-08-19 NOTE — Telephone Encounter (Signed)
Driving is restricted and if needed we can write a note tjhanks both of you

## 2016-08-19 NOTE — Telephone Encounter (Signed)
Pt called and stated that she used her symptom activator on Sunday 08-18-16 b/c she passed out. She stated that she was dizzy, and she felt a tightness around her throat, no shortness of breath, some chest tightness and she said after the episode was over her left arm was numb. Pt was passed out for less than a minute. Pt stated that she has had the left arm numbness after previous episodes. Instructed pt to send a remote transmission. Remote transmission received. Instructed pt that device tech RN will review and call her back with results. She stated that if she can not drive for 6 months she will need a note for work.

## 2016-08-19 NOTE — Telephone Encounter (Signed)
Reviewed episode with Dr. Caryl Comes. No recommendations made as the ECG shows NSR. Driving restrictions TBD- Dr. Caryl Comes in clinic. I made Erika [name] aware- she states that she will not drive until she hears back as far as driving recommendations from Dr. Caryl Comes.

## 2016-08-20 ENCOUNTER — Ambulatory Visit
Admission: RE | Admit: 2016-08-20 | Discharge: 2016-08-20 | Disposition: A | Discharge status: Home / Self Care | Payer: BLUE CROSS/BLUE SHIELD | Source: Ambulatory Visit | Attending: Obstetrics & Gynecology | Admitting: Obstetrics & Gynecology

## 2016-08-20 DIAGNOSIS — Z1231 Encounter for screening mammogram for malignant neoplasm of breast: Secondary | ICD-10-CM

## 2016-08-21 ENCOUNTER — Encounter: Payer: Self-pay | Admitting: Cardiology

## 2016-08-21 ENCOUNTER — Other Ambulatory Visit: Payer: Self-pay | Admitting: Obstetrics & Gynecology

## 2016-08-21 DIAGNOSIS — R928 Other abnormal and inconclusive findings on diagnostic imaging of breast: Secondary | ICD-10-CM

## 2016-08-22 ENCOUNTER — Encounter: Payer: Self-pay | Admitting: Cardiology

## 2016-08-22 NOTE — Telephone Encounter (Signed)
Spoke w/ pt and gave her driving restrictions. Pt stated that she wanted the letter mailed. Informed pt that I would print and put it in the mail today. Pt verbalized understanding.

## 2016-08-23 ENCOUNTER — Ambulatory Visit
Admission: RE | Admit: 2016-08-23 | Discharge: 2016-08-23 | Disposition: A | Discharge status: Home / Self Care | Payer: BLUE CROSS/BLUE SHIELD | Source: Ambulatory Visit | Attending: Obstetrics & Gynecology | Admitting: Obstetrics & Gynecology

## 2016-08-23 DIAGNOSIS — R928 Other abnormal and inconclusive findings on diagnostic imaging of breast: Secondary | ICD-10-CM

## 2016-08-23 DIAGNOSIS — N6489 Other specified disorders of breast: Secondary | ICD-10-CM | POA: Diagnosis not present

## 2016-08-27 LAB — CUP PACEART REMOTE DEVICE CHECK
Implantable Pulse Generator Implant Date: 20170719
MDC IDC SESS DTM: 20171216123649

## 2016-09-03 DIAGNOSIS — Z713 Dietary counseling and surveillance: Secondary | ICD-10-CM | POA: Diagnosis not present

## 2016-09-03 DIAGNOSIS — Z13220 Encounter for screening for lipoid disorders: Secondary | ICD-10-CM | POA: Diagnosis not present

## 2016-09-03 DIAGNOSIS — Z23 Encounter for immunization: Secondary | ICD-10-CM | POA: Diagnosis not present

## 2016-09-03 DIAGNOSIS — Z136 Encounter for screening for cardiovascular disorders: Secondary | ICD-10-CM | POA: Diagnosis not present

## 2016-09-03 DIAGNOSIS — Z6837 Body mass index (BMI) 37.0-37.9, adult: Secondary | ICD-10-CM | POA: Diagnosis not present

## 2016-09-04 ENCOUNTER — Ambulatory Visit (INDEPENDENT_AMBULATORY_CARE_PROVIDER_SITE_OTHER): Payer: BLUE CROSS/BLUE SHIELD | Admitting: *Deleted

## 2016-09-04 DIAGNOSIS — R55 Syncope and collapse: Secondary | ICD-10-CM

## 2016-09-04 NOTE — Progress Notes (Signed)
Carelink Summary Report / Loop Recorder 

## 2016-09-10 ENCOUNTER — Other Ambulatory Visit: Payer: Self-pay | Admitting: Internal Medicine

## 2016-09-14 LAB — CUP PACEART REMOTE DEVICE CHECK
Date Time Interrogation Session: 20180115130554
MDC IDC PG IMPLANT DT: 20170719

## 2016-09-14 NOTE — Progress Notes (Signed)
Carelink summary report received. Battery status OK. Normal device function. No new symptom episodes, tachy episodes, brady, or pause episodes. No new AF episodes. Monthly summary reports and ROV/PRN 

## 2016-09-30 LAB — CUP PACEART REMOTE DEVICE CHECK
Implantable Pulse Generator Implant Date: 20170719
MDC IDC SESS DTM: 20180214130825

## 2016-10-04 ENCOUNTER — Ambulatory Visit (INDEPENDENT_AMBULATORY_CARE_PROVIDER_SITE_OTHER): Payer: BLUE CROSS/BLUE SHIELD | Admitting: *Deleted

## 2016-10-04 DIAGNOSIS — R55 Syncope and collapse: Secondary | ICD-10-CM | POA: Diagnosis not present

## 2016-10-04 NOTE — Progress Notes (Signed)
Carelink Summary Report / Loop Recorder 

## 2016-10-12 LAB — CUP PACEART REMOTE DEVICE CHECK
Date Time Interrogation Session: 20180316133635
MDC IDC PG IMPLANT DT: 20170719

## 2016-10-12 NOTE — Progress Notes (Signed)
Carelink summary report received. Battery status OK. Normal device function. No new symptom episodes, tachy episodes, brady, or pause episodes. No new AF episodes. Monthly summary reports and ROV/PRN 

## 2016-11-04 ENCOUNTER — Ambulatory Visit (INDEPENDENT_AMBULATORY_CARE_PROVIDER_SITE_OTHER): Payer: BLUE CROSS/BLUE SHIELD | Admitting: *Deleted

## 2016-11-04 DIAGNOSIS — R55 Syncope and collapse: Secondary | ICD-10-CM

## 2016-11-04 NOTE — Progress Notes (Signed)
Carelink Summary Report / Loop Recorder 

## 2016-11-11 ENCOUNTER — Encounter: Payer: Self-pay | Admitting: Internal Medicine

## 2016-11-11 NOTE — Telephone Encounter (Signed)
Spoke with patient and gave her [employer] tech services number for further help with home monitor.

## 2016-11-17 LAB — CUP PACEART REMOTE DEVICE CHECK
Date Time Interrogation Session: 20180415140631
MDC IDC PG IMPLANT DT: 20170719

## 2016-11-17 NOTE — Progress Notes (Signed)
Carelink summary report received. Battery status OK. Normal device function. No new symptom episodes, tachy episodes, brady, or pause episodes. No new AF episodes. Monthly summary reports and ROV/PRN 

## 2016-11-19 ENCOUNTER — Ambulatory Visit (INDEPENDENT_AMBULATORY_CARE_PROVIDER_SITE_OTHER): Payer: BLUE CROSS/BLUE SHIELD | Admitting: Obstetrics & Gynecology

## 2016-11-19 ENCOUNTER — Encounter: Payer: Self-pay | Admitting: Obstetrics & Gynecology

## 2016-11-19 VITALS — BP 108/66 | HR 72 | Ht 62.5 in | Wt 214.0 lb

## 2016-11-19 DIAGNOSIS — Z01419 Encounter for gynecological examination (general) (routine) without abnormal findings: Secondary | ICD-10-CM | POA: Diagnosis not present

## 2016-11-19 DIAGNOSIS — R232 Flushing: Secondary | ICD-10-CM | POA: Diagnosis not present

## 2016-11-19 DIAGNOSIS — Z0000 Encounter for general adult medical examination without abnormal findings: Secondary | ICD-10-CM

## 2016-11-19 LAB — TSH: TSH: 2.32 mIU/L

## 2016-11-19 LAB — CBC
HCT: 46.2 % — ABNORMAL HIGH (ref 35.0–45.0)
HEMOGLOBIN: 16 g/dL — AB (ref 11.7–15.5)
MCH: 31.2 pg (ref 27.0–33.0)
MCHC: 34.6 g/dL (ref 32.0–36.0)
MCV: 90.1 fL (ref 80.0–100.0)
MPV: 9 fL (ref 7.5–12.5)
PLATELETS: 357 10*3/uL (ref 140–400)
RBC: 5.13 MIL/uL — AB (ref 3.80–5.10)
RDW: 13 % (ref 11.0–15.0)
WBC: 9.1 10*3/uL (ref 3.8–10.8)

## 2016-11-19 LAB — LIPID PANEL
CHOLESTEROL: 191 mg/dL (ref <200)
HDL: 53 mg/dL (ref >50)
LDL CALC: 117 mg/dL — AB (ref <100)
TRIGLYCERIDES: 105 mg/dL (ref <150)
Total CHOL/HDL Ratio: 3.6 Ratio (ref <5.0)
VLDL: 21 mg/dL (ref <30)

## 2016-11-19 NOTE — Progress Notes (Signed)
46 y.o. G3P2 MarriedCaucasianF here for annual exam.  Having syncopal issues.  She's had three episodes where she either passed out or almost passed out.  Now has internal monitor.  Did stress test and echocardiology, EEG and MRI for evaluation.  Has seen cardiology and neurology.  No one can explain her symptoms.  She is not allowed to drive at this point.  Patient's last menstrual period was 08/20/2011 (exact date).          Sexually active: Yes.    The current method of family planning is status post hysterectomy.    Exercising: Yes.    walking. Smoker:  no  Health Maintenance: Pap: 02/07/11 Neg History of abnormal Pap:  no MMG: 08/20/16 BIRADS0, Density B, [facility]; 08/23/16 Dx & U/S, BIRADS3 Probably Benign, Repeat U/S R Breast recommended Colonoscopy: 06/2012 Normal.  Repeat 10 years  BMD:  Never TDaP:  06/02/14 Screening Labs: PCP   reports that she has never smoked. She has never used smokeless tobacco. She reports that she drinks alcohol. She reports that she does not use drugs.  Past Medical History:  Diagnosis Date  . Abnormal uterine bleeding   . Complicated migraine 10/02/9700  . Headache(784.0)    history, otc med prn, last one 2 yrs ago  . Migraine with aura 02/12/2013  . Stress    saw neurologist 6/15 for numbess in face and arm    Past Surgical History:  Procedure Laterality Date  . CESAREAN SECTION     x 2  . CYSTOSCOPY  09/10/2011   Procedure: CYSTOSCOPY;  Surgeon: Felipa Emory, MD;  Location: [facility];  Service: Gynecology;  Laterality: N/A;  . DILATION AND CURETTAGE OF UTERUS    . ENDOMETRIAL ABLATION     failed - done in MD office  . EP IMPLANTABLE DEVICE N/A 02/07/2016   Procedure: Loop Recorder Insertion;  Surgeon: Deboraha Sprang, MD;  Location: [facility];  Service: Cardiovascular;  Laterality: N/A;  . LAPAROSCOPIC TOTAL HYSTERECTOMY     robotic  . TUBAL LIGATION    . WISDOM TOOTH EXTRACTION      Current Outpatient Prescriptions   Medication Sig Dispense Refill  . Probiotic Product (PROBIOTIC DAILY PO) Take 1 capsule by mouth daily.    Marland Kitchen scopolamine (TRANSDERM-SCOP) 1 MG/3DAYS Place 1 patch (1.5 mg total) onto the skin every 3 (three) days. 10 patch 12   No current facility-administered medications for this visit.     Family History  Problem Relation Age of Onset  . Diabetes Maternal Grandfather   . Lung cancer Maternal Grandfather   . Diabetes Paternal Grandmother   . Diabetes Paternal Grandfather   . Kidney Stones Father   . Gallbladder disease Mother   . Colon cancer Maternal Aunt 2    second occurence age 6  . Colon cancer Cousin 34    paternal cousin    ROS:  Pertinent items are noted in HPI.  Otherwise, a comprehensive ROS was negative.  Exam:   BP 108/66 (BP Location: Right Arm, Patient Position: Sitting, Cuff Size: Large)   Pulse 72   Ht 5' 2.5" (1.588 m)   Wt 214 lb (97.1 kg)   LMP 08/20/2011 (Exact Date)   BMI 38.52 kg/m   Weight change: +13#   Height: 5' 2.5" (158.8 cm)  Ht Readings from Last 3 Encounters:  11/19/16 5' 2.5" (1.588 m)  05/22/16 5\' 3"  (1.6 m)  02/07/16 5\' 3"  (1.6 m)    General appearance:  alert, cooperative and appears stated age Head: Normocephalic, without obvious abnormality, atraumatic Neck: no adenopathy, supple, symmetrical, trachea midline and thyroid normal to inspection and palpation Lungs: clear to auscultation bilaterally Breasts: normal appearance, no masses or tenderness Heart: regular rate and rhythm Abdomen: soft, non-tender; bowel sounds normal; no masses,  no organomegaly Extremities: extremities normal, atraumatic, no cyanosis or edema Skin: Skin color, texture, turgor normal. No rashes or lesions Lymph nodes: Cervical, supraclavicular, and axillary nodes normal. No abnormal inguinal nodes palpated Neurologic: Grossly normal   Pelvic: External genitalia:  Dark mole on right mons, rough pink lesion on righ tinner thigh              Urethra:   normal appearing urethra with no masses, tenderness or lesions              Bartholins and Skenes: normal                 Vagina: normal appearing vagina with normal color and discharge, no lesions              Cervix: absent              Pap taken: No. Bimanual Exam:  Uterus:  uterus absent              Adnexa: no mass, fullness, tenderness               Rectovaginal: Confirms               Anus:  normal sphincter tone, no lesions  Chaperone was present for exam.  A:  Well Woman with normal exam H/O migraines Recent syncopal and pre-syncopal episodes H/O TLH/bilateral salpingectomy 2/13 Family hx of colon cancer  P:   Mammogram guidelines reviewed.  Pt has six month follow up scheduled. pap smear not indicated TSH, Lipids, FSH and lipids obtained today Colonoscopy 2013.  10 year follow up recommended. Return for excision of right mons and right inner thigh lesion Return annually or prn

## 2016-11-19 NOTE — Progress Notes (Signed)
Patient scheduled while in office for mole removal with Dr. Sabra Heck on 11/28/16 at [facility]. Patient is agreeable to date and time.

## 2016-11-20 LAB — FOLLICLE STIMULATING HORMONE: FSH: 93.1 m[IU]/mL

## 2016-11-27 ENCOUNTER — Encounter: Payer: Self-pay | Admitting: Obstetrics & Gynecology

## 2016-11-28 ENCOUNTER — Ambulatory Visit (INDEPENDENT_AMBULATORY_CARE_PROVIDER_SITE_OTHER): Payer: BLUE CROSS/BLUE SHIELD | Admitting: Obstetrics & Gynecology

## 2016-11-28 VITALS — BP 110/84 | HR 96 | Resp 16 | Ht 62.5 in | Wt 217.0 lb

## 2016-11-28 DIAGNOSIS — D582 Other hemoglobinopathies: Secondary | ICD-10-CM

## 2016-11-28 DIAGNOSIS — D4959 Neoplasm of unspecified behavior of other genitourinary organ: Secondary | ICD-10-CM

## 2016-11-28 DIAGNOSIS — N9089 Other specified noninflammatory disorders of vulva and perineum: Secondary | ICD-10-CM | POA: Diagnosis not present

## 2016-11-28 DIAGNOSIS — B359 Dermatophytosis, unspecified: Secondary | ICD-10-CM

## 2016-11-28 MED ORDER — SULFAMETHOXAZOLE-TRIMETHOPRIM 800-160 MG PO TABS: ORAL_TABLET | ORAL | 0 refills | Status: DC

## 2016-11-28 MED ORDER — FLUCONAZOLE 200 MG PO TABS: 200.0000 mg | ORAL_TABLET | Freq: Every day | ORAL | 0 refills | Status: DC

## 2016-11-29 ENCOUNTER — Other Ambulatory Visit: Payer: Self-pay | Admitting: Obstetrics & Gynecology

## 2016-11-29 DIAGNOSIS — D225 Melanocytic nevi of trunk: Secondary | ICD-10-CM | POA: Diagnosis not present

## 2016-11-29 DIAGNOSIS — D2271 Melanocytic nevi of right lower limb, including hip: Secondary | ICD-10-CM | POA: Diagnosis not present

## 2016-11-29 NOTE — Progress Notes (Addendum)
GYNECOLOGY  VISIT   HPI: 46 y.o. G60P2002 Married Caucasian female here for removal of darkening and enlarging mole on the vulva as well as removal of a left inner thigh lesion that the patient finds very bothersome.  Procedure, risks and benefits removed.  Pt ready to proceed.  Pt's lab work from last week reviewed.  Hb was elevated at 16.  D/W pt Ennever who did not recommend consultation yet and recommended repeat six weeks and for pt to take a baby ASA, 81mg .  Advised to start this in a day or two.  Will plan follow up lab work after procedure today.  If Hb is still elevated then, she is aware she will need a formal hematology consultation at that point.  Pt comfortable with plan.  She is not a smoker.  D/w pt most common causes of elevated hb and risk for clot.  Pt also has several areas that she thinks are ringworm that she's like me to look at.  She's using lotrimin OTC with some success.  GYNECOLOGIC HISTORY: Patient's last menstrual period was 08/20/2011 (exact date). Contraception: hysterectomy  Patient Active Problem List   Diagnosis Date Noted  . Syncope 02/07/2016  . Migraine with aura 02/12/2013  . Complicated migraine 38/25/0539    Past Medical History:  Diagnosis Date  . Abnormal uterine bleeding   . Complicated migraine 7/67/3419  . Headache(784.0)    history, otc med prn, last one 2 yrs ago  . Migraine with aura 02/12/2013  . Stress    saw neurologist 6/15 for numbess in face and arm    Past Surgical History:  Procedure Laterality Date  . CESAREAN SECTION     x 2  . CYSTOSCOPY  09/10/2011   Procedure: CYSTOSCOPY;  Surgeon: Felipa Emory, MD;  Location: [facility];  Service: Gynecology;  Laterality: N/A;  . DILATION AND CURETTAGE OF UTERUS    . ENDOMETRIAL ABLATION     failed - done in MD office  . EP IMPLANTABLE DEVICE N/A 02/07/2016   Procedure: Loop Recorder Insertion;  Surgeon: Deboraha Sprang, MD;  Location: [facility];  Service: Cardiovascular;   Laterality: N/A;  . LAPAROSCOPIC TOTAL HYSTERECTOMY     robotic  . TUBAL LIGATION    . WISDOM TOOTH EXTRACTION      MEDS:  Reviewed in EPIC and UTD  ALLERGIES: Penicillins and Zomig [zolmitriptan]  Family History  Problem Relation Age of Onset  . Diabetes Maternal Grandfather   . Lung cancer Maternal Grandfather   . Diabetes Paternal Grandmother   . Diabetes Paternal Grandfather   . Kidney Stones Father   . Gallbladder disease Mother   . Colon cancer Maternal Aunt 59       second occurence age 64  . Colon cancer Cousin 65       paternal cousin    SH:  Married, non smoker  Review of Systems  All other systems reviewed and are negative.   PHYSICAL EXAMINATION:    BP 110/84 (BP Location: Left Arm, Patient Position: Sitting, Cuff Size: Large)   Pulse 96   Resp 16   Ht 5' 2.5" (1.588 m)   Wt 217 lb (98.4 kg)   LMP 08/20/2011 (Exact Date)   BMI 39.06 kg/m     Physical Exam  Constitutional: She is oriented to person, place, and time. She appears well-developed and well-nourished.  Genitourinary:     Neurological: She is alert and oriented to person, place, and time.  Skin:  Skin is warm and dry.  Right axilla and right thigh near groin has erythematous patchy rash with central clearing c/w tinea.  Psychiatric: She has a normal mood and affect.   Procedure: Sterile technique used throughout procedure.  Area where larger lesion (2.0cm) was present was cleansed with Betadine x 3 and sterile towels placed.  3c 1% lidocaine plain instilled.  Using #11 blade, lesion excised with ellipse.  Four interrupted sutures placed for incision closure and excellent hemostasis.  Dressing was applied.  Sterile field wwas compromised by pt during procedure.  Area redraped and gloves and instruments changed.  She will be treated with antibiotics.  Then attention turned to right inner thigh.  Area cleansed with Betadine x 3.  1% Lidocaine instilled beneath lesion.  1cc used.  Using sterile  pickups and forceps, lesion excised.  Silver nitrate used for excellent hemostasis.  Pt tolerated procedure well.  Dressing applied.    Chaperone was present for exam.  Assessment: Excision of vulvar mole and biopsy/excision of right inner thigh lesion Elevated Hb Skin tinea  Plan: Both will be sent to pathology.  Results will be called to pt. Repeat CBC in six weeks.  Order placed.  Pt will start baby ASA tomorrow. Diflucan 200mg  ever other day for three doses.  Pt should continue topical OTC treatment as well.

## 2016-11-29 NOTE — Addendum Note (Signed)
Addended by: Megan Salon on: 11/29/2016 05:10 PM   Modules accepted: Orders

## 2016-11-29 NOTE — Addendum Note (Signed)
Addended by: Megan Salon on: 11/29/2016 06:43 AM   Modules accepted: Level of Service

## 2016-12-03 ENCOUNTER — Ambulatory Visit (INDEPENDENT_AMBULATORY_CARE_PROVIDER_SITE_OTHER): Payer: BLUE CROSS/BLUE SHIELD | Admitting: *Deleted

## 2016-12-03 DIAGNOSIS — R55 Syncope and collapse: Secondary | ICD-10-CM

## 2016-12-03 NOTE — Progress Notes (Signed)
Carelink Summary Report / Loop Recorder 

## 2016-12-04 ENCOUNTER — Telehealth: Payer: Self-pay

## 2016-12-04 NOTE — Telephone Encounter (Signed)
-----   Message from Megan Salon, MD sent at 12/04/2016  1:29 PM EDT ----- Please let pt know her pathology on the large mole showed moderate atypia.  It did have a positive margin and needs to be reexcised.  Need to let this heal and repeat excision in about 3-4 weeks.  Ok to schedule.    Second mole on inner thigh was completely benign.  Nothing else needed with this.

## 2016-12-04 NOTE — Telephone Encounter (Signed)
Spoke with patient. Advised of results and message as seen below from [place]. Patient is agreeable and verbalizes understanding. Appointment scheduled for 12/26/2016 at 10 am with Dr.Miller. Patient is agreeable to date and time.  Routing to provider for final review. Patient agreeable to disposition. Will close encounter.

## 2016-12-10 NOTE — Addendum Note (Signed)
Addended by: Polly Cobia on: 12/10/2016 09:38 AM   Modules accepted: Orders

## 2016-12-12 LAB — CUP PACEART REMOTE DEVICE CHECK
Date Time Interrogation Session: 20180515140926
Implantable Pulse Generator Implant Date: 20170719

## 2016-12-12 NOTE — Progress Notes (Signed)
Carelink summary report received. Battery status OK. Normal device function. No new symptom episodes, tachy episodes, brady, or pause episodes. No new AF episodes. Monthly summary reports and ROV/PRN 

## 2016-12-26 ENCOUNTER — Ambulatory Visit (INDEPENDENT_AMBULATORY_CARE_PROVIDER_SITE_OTHER): Payer: BLUE CROSS/BLUE SHIELD | Admitting: Obstetrics & Gynecology

## 2016-12-26 VITALS — BP 110/70 | HR 88 | Resp 16 | Ht 62.5 in | Wt 218.0 lb

## 2016-12-26 DIAGNOSIS — D229 Melanocytic nevi, unspecified: Secondary | ICD-10-CM

## 2016-12-26 MED ORDER — SULFAMETHOXAZOLE-TRIMETHOPRIM 800-160 MG PO TABS: 1.0000 | ORAL_TABLET | Freq: Two times a day (BID) | ORAL | 0 refills | Status: DC

## 2016-12-26 NOTE — Patient Instructions (Signed)
Vulvar Biopsy Post-procedure Instructions . You may take Ibuprofen, Aleve, or Tylenol for any pain or discomfort. . You may have a small amount of spotting.  You should wear a mini pad for the next few days. . You may use some topical Neosporin ointment (over the counter) if you would like after one week.  . You will need to call the office if you have redness around the biopsy site, if there is any unusual drainage, if the bleeding is heavy, or if you have any concerns.   . You may shower in 24 hours. . Your pathology report will be called to you . Finish the antibiotic prescription

## 2016-12-27 ENCOUNTER — Encounter: Payer: Self-pay | Admitting: Internal Medicine

## 2016-12-27 DIAGNOSIS — L988 Other specified disorders of the skin and subcutaneous tissue: Secondary | ICD-10-CM | POA: Diagnosis not present

## 2016-12-27 NOTE — Telephone Encounter (Signed)
LVM for pt to send a manual transmission. Number to device clinic given.

## 2016-12-29 NOTE — Progress Notes (Signed)
GYNECOLOGY  VISIT   HPI: 46 y.o. G67P2002 Married Caucasian female here for re-excision of vulva lesion that was done 11/28/16.  Pathology showed: 1. 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  Re-excision recommended due to atypia and positive margin.  She is here for this today.  Her daughter removed the sutures the last time and she would like to do this again, unless she is having any issues or concerns.  GYNECOLOGIC HISTORY: Patient's last menstrual period was 08/20/2011 (exact date). Contraception: hysterectomy Menopausal hormone therapy: none  Patient Active Problem List   Diagnosis Date Noted  . Syncope 02/07/2016  . Migraine with aura 02/12/2013  . Complicated migraine 42/68/3419    Past Medical History:  Diagnosis Date  . Abnormal uterine bleeding   . Complicated migraine 01/10/2978  . Headache(784.0)    history, otc med prn, last one 2 yrs ago  . Migraine with aura 02/12/2013  . Stress    saw neurologist 6/15 for numbess in face and arm    Past Surgical History:  Procedure Laterality Date  . CESAREAN SECTION     x 2  . CYSTOSCOPY  09/10/2011   Procedure: CYSTOSCOPY;  Surgeon: Felipa Emory, MD;  Location: [facility];  Service: Gynecology;  Laterality: N/A;  . DILATION AND CURETTAGE OF UTERUS    . ENDOMETRIAL ABLATION     failed - done in MD office  . EP IMPLANTABLE DEVICE N/A 02/07/2016   Procedure: Loop Recorder Insertion;  Surgeon: Deboraha Sprang, MD;  Location: [facility];  Service: Cardiovascular;  Laterality: N/A;  . LAPAROSCOPIC TOTAL HYSTERECTOMY     robotic  . TUBAL LIGATION    . WISDOM TOOTH EXTRACTION      MEDS:  Reviewed in EPIC and UTD  ALLERGIES: Penicillins and Zomig [zolmitriptan]  Family History  Problem Relation Age of Onset  . Diabetes Maternal Grandfather   . Lung cancer Maternal Grandfather   . Diabetes Paternal Grandmother   . Diabetes Paternal  Grandfather   . Kidney Stones Father   . Gallbladder disease Mother   . Colon cancer Maternal Aunt 94       second occurence age 39  . Colon cancer Cousin 61       paternal cousin    SH:  Married, non smoker  Review of Systems  All other systems reviewed and are negative.   PHYSICAL EXAMINATION:    BP 110/70 (BP Location: Right Arm, Patient Position: Sitting, Cuff Size: Normal)   Pulse 88   Resp 16   Ht 5' 2.5" (1.588 m)   Wt 218 lb (98.9 kg)   LMP 08/20/2011 (Exact Date)   BMI 39.24 kg/m     Physical Exam  Constitutional: She appears well-developed and well-nourished.  Genitourinary:     Lymphadenopathy:       Right: No inguinal adenopathy present.       Left: No inguinal adenopathy present.   Procedure:   Area cleansed with Betadine x 3 and draped in normal, sterile fashion.  Using sterile technique throughout procedure, area was anesthetized with 1% Lidocaine, 3cc total used.  Then using #11 blade, excision around previous incision was made in an ellipse shape.  2.5cm width of lesion was fully excised.  Seven single interrupted sutures with #2.0 Vicryl placed for excellent hemostasis.  Pt tolerated procedure well.  Incision dressed.    Chaperone was present for exam.  Assessment: Vuvlar nevus  with moderate atypia, reexcised today  Plan: Tissue sent the pathology.  Pt will be called with results.  Suture removal in two weeks.  Bactrim DS bid x 7 days sent to pharmacy.

## 2016-12-31 ENCOUNTER — Telehealth: Payer: Self-pay | Admitting: *Deleted

## 2016-12-31 ENCOUNTER — Ambulatory Visit (INDEPENDENT_AMBULATORY_CARE_PROVIDER_SITE_OTHER): Payer: BLUE CROSS/BLUE SHIELD | Admitting: Internal Medicine

## 2016-12-31 VITALS — BP 120/78 | HR 98 | Ht 63.0 in | Wt 215.0 lb

## 2016-12-31 DIAGNOSIS — R55 Syncope and collapse: Secondary | ICD-10-CM | POA: Diagnosis not present

## 2016-12-31 NOTE — Patient Instructions (Addendum)
Medication Instructions: - Your physician recommends that you continue on your current medications as directed. Please refer to the Current Medication list given to you today.  Labwork: - none ordered  Procedures/Testing: - none ordered  Follow-Up: - Your physician recommends that you schedule a follow-up appointment in: 10-12 weeks with Dr. Caryl Comes.  Any Additional Special Instructions Will Be Listed Below (If Applicable).  - wear an abdominal binder during your waking hours   If you need a refill on your cardiac medications before your next appointment, please call your pharmacy.

## 2016-12-31 NOTE — Telephone Encounter (Signed)
Spoke with patient.  Advised that Dr. Caryl Comes reviewed symptom episode from 12/26/16 and recommended non-urgent appointment.  Offered opening today at 3:30pm.  Patient will have to make sure she can get a ride to this appointment, but will plan for today at 3:30pm.  She will call back if she needs to reschedule.  Patient is aware that Dr. Caryl Comes will be out of the office until the week after next after today.  She is appreciative and denies additional questions or concerns at this time.

## 2016-12-31 NOTE — Progress Notes (Signed)
Patient Care Team: Dettinger, Fransisca Kaufmann, MD as PCP - General (Family Medicine)   HPI  Erika [name] is a 46 y.o. female Seen in followup for syncope and ILR implantation 7/17  She is seen today because of recurrent syncope. We reviewed the stories. The first occurred while eating french fries and drinking in the car. Second episode occurred also while eating drinking. The  first of these episodes was associated with pain as had been the third and fourth. The second episode was unassociated with pain. The others have also been associated with eating and drinking. She is described with this event as being extremely pale. Interrogation of her loop recorder demonstrated gradual sinus slowing and then bradycardia with rates in the 20s for 2 beats.    Records and Results Reviewed  Past Medical History:  Diagnosis Date  . Abnormal uterine bleeding   . Complicated migraine 0/02/6760  . Headache(784.0)    history, otc med prn, last one 2 yrs ago  . Migraine with aura 02/12/2013  . Stress    saw neurologist 6/15 for numbess in face and arm    Past Surgical History:  Procedure Laterality Date  . CESAREAN SECTION     x 2  . CYSTOSCOPY  09/10/2011   Procedure: CYSTOSCOPY;  Surgeon: Felipa Emory, MD;  Location: [facility];  Service: Gynecology;  Laterality: N/A;  . DILATION AND CURETTAGE OF UTERUS    . ENDOMETRIAL ABLATION     failed - done in MD office  . EP IMPLANTABLE DEVICE N/A 02/07/2016   Procedure: Loop Recorder Insertion;  Surgeon: Deboraha Sprang, MD;  Location: [facility];  Service: Cardiovascular;  Laterality: N/A;  . LAPAROSCOPIC TOTAL HYSTERECTOMY     robotic  . TUBAL LIGATION    . WISDOM TOOTH EXTRACTION      Current Outpatient Prescriptions  Medication Sig Dispense Refill  . aspirin EC 81 MG tablet Take 81 mg by mouth daily.    . Probiotic Product (PROBIOTIC DAILY PO) Take 1 capsule by mouth daily.    Marland Kitchen scopolamine (TRANSDERM-SCOP) 1 MG/3DAYS Place 1 patch  (1.5 mg total) onto the skin every 3 (three) days. 10 patch 12   No current facility-administered medications for this visit.     Allergies  Allergen Reactions  . Penicillins Rash    Has patient had a PCN reaction causing immediate rash, facial/tongue/throat swelling, SOB or lightheadedness with hypotension: Yes Has patient had a PCN reaction causing severe rash involving mucus membranes or skin necrosis: No Has patient had a PCN reaction that required hospitalization No Has patient had a PCN reaction occurring within the last 10 years: Yes If all of the above answers are "NO", then may proceed with Cephalosporin use.   . Zomig [Zolmitriptan] Rash      Review of Systems negative except from HPI and PMH  Physical Exam BP 120/78   Pulse 98   Ht 5\' 3"  (1.6 m)   Wt 215 lb (97.5 kg)   LMP 08/20/2011 (Exact Date)   SpO2 98%   BMI 38.09 kg/m  Well developed and nourished in no acute distress HENT normal Neck supple with JVP-flat Clear Regular rate and rhythm, no murmurs or gallops Abd-soft with active BS No Clubbing cyanosis edema Skin-warm and dry A & Oriented  Grossly normal sensory and motor function   Sinus at 85 Intervals 13/09/40 Otherwise normal  Assessment and  Plan  Syncope-Deglutition   Implantable loop recorder  We discussed extensively the issues of dysautonomia, the physiology of deglutition and sympathetic withdrawal and vagal outpouring.  Given the trigger of the evening, pacing is often recommended. 2 thoughts come to mind as alternatives given this lady's young age. One  is use of an abdominal binder and the other is yohimbine. We will try them sequentially.  We also discussed pacing in the event that we were going to pursue this with you CLS  More than 50% of 45 min was spent in counseling related to the above

## 2017-01-01 ENCOUNTER — Telehealth: Payer: Self-pay | Admitting: *Deleted

## 2017-01-01 NOTE — Telephone Encounter (Signed)
-----   Message from Megan Salon, MD sent at 12/31/2016  4:57 PM EDT ----- Please let pt know her repeat excision was negative for abnormal findings.  No atypical cells were seen.  I'm happy to remove the sutures in two weeks but her daughter did it last time and that is ok with me.  Please let her know to call with any concerns.

## 2017-01-01 NOTE — Telephone Encounter (Signed)
Spoke with patient, advised of results as seen below per Dr. Sabra Heck. Patient states she will have daughter remove sutures. Patient states she is scheduled to have a repeat CBC on 01/09/17, wanted to let Dr. Sabra Heck know that she passed out again and cardiologist noted something on the monitor, believes episodes are related to swallowing, recommending pacemaker. Patient ask that Dr. Sabra Heck take a look at this as well, would like her thoughts. Advised patient Dr. Sabra Heck is out of the office today, will review once she returns on 6/14 and return call with recommendations. Patient verbalizes understanding and is agreeable.   Dr. Sabra Heck please review and advise?

## 2017-01-02 ENCOUNTER — Ambulatory Visit (INDEPENDENT_AMBULATORY_CARE_PROVIDER_SITE_OTHER): Payer: BLUE CROSS/BLUE SHIELD | Admitting: *Deleted

## 2017-01-02 ENCOUNTER — Encounter: Payer: Self-pay | Admitting: Internal Medicine

## 2017-01-02 DIAGNOSIS — R55 Syncope and collapse: Secondary | ICD-10-CM

## 2017-01-02 NOTE — Telephone Encounter (Signed)
Please let her know I reviewed Dr. Olin Pia note and her pulse goes down into the 20's when she has one of her episodes.  A pacemaker should prevent this from happening.  He did suggest two other alternatives but I'm afraid she's going to have a head injury with the passing out or be driving when this happens.  I would absolutely consider a pacemake if it were me.  Thanks.

## 2017-01-02 NOTE — Telephone Encounter (Signed)
Spoke with patient, advised as seen below per Dr. Sabra Heck. Patient thankful for feedback. Patient verbalizes understanding and is agreeable.  Routing to provider for final review. Patient is agreeable to disposition. Will close encounter.

## 2017-01-03 LAB — CUP PACEART INCLINIC DEVICE CHECK
Date Time Interrogation Session: 20180615144825
Implantable Pulse Generator Implant Date: 20170719

## 2017-01-03 NOTE — Progress Notes (Signed)
Carelink Summary Report / Loop Recorder 

## 2017-01-08 ENCOUNTER — Other Ambulatory Visit: Payer: Self-pay | Admitting: *Deleted

## 2017-01-08 DIAGNOSIS — R899 Unspecified abnormal finding in specimens from other organs, systems and tissues: Secondary | ICD-10-CM

## 2017-01-09 ENCOUNTER — Other Ambulatory Visit (INDEPENDENT_AMBULATORY_CARE_PROVIDER_SITE_OTHER): Payer: BLUE CROSS/BLUE SHIELD

## 2017-01-09 DIAGNOSIS — R899 Unspecified abnormal finding in specimens from other organs, systems and tissues: Secondary | ICD-10-CM | POA: Diagnosis not present

## 2017-01-10 ENCOUNTER — Other Ambulatory Visit: Payer: Self-pay | Admitting: *Deleted

## 2017-01-10 ENCOUNTER — Encounter: Payer: Self-pay | Admitting: *Deleted

## 2017-01-10 DIAGNOSIS — G901 Familial dysautonomia [Riley-Day]: Secondary | ICD-10-CM

## 2017-01-10 DIAGNOSIS — Z01812 Encounter for preprocedural laboratory examination: Secondary | ICD-10-CM

## 2017-01-10 DIAGNOSIS — R55 Syncope and collapse: Secondary | ICD-10-CM

## 2017-01-10 LAB — CBC
Hematocrit: 40.2 % (ref 34.0–46.6)
Hemoglobin: 13.6 g/dL (ref 11.1–15.9)
MCH: 30.4 pg (ref 26.6–33.0)
MCHC: 33.8 g/dL (ref 31.5–35.7)
MCV: 90 fL (ref 79–97)
PLATELETS: 311 10*3/uL (ref 150–379)
RBC: 4.48 x10E6/uL (ref 3.77–5.28)
RDW: 13.5 % (ref 12.3–15.4)
WBC: 9.9 10*3/uL (ref 3.4–10.8)

## 2017-01-20 ENCOUNTER — Other Ambulatory Visit: Payer: BLUE CROSS/BLUE SHIELD

## 2017-01-27 ENCOUNTER — Other Ambulatory Visit: Payer: BLUE CROSS/BLUE SHIELD

## 2017-01-27 DIAGNOSIS — G909 Disorder of the autonomic nervous system, unspecified: Secondary | ICD-10-CM | POA: Diagnosis not present

## 2017-01-27 DIAGNOSIS — R55 Syncope and collapse: Secondary | ICD-10-CM

## 2017-01-27 DIAGNOSIS — G901 Familial dysautonomia [Riley-Day]: Secondary | ICD-10-CM

## 2017-01-27 DIAGNOSIS — Z01812 Encounter for preprocedural laboratory examination: Secondary | ICD-10-CM

## 2017-01-28 ENCOUNTER — Other Ambulatory Visit: Payer: Self-pay | Admitting: Obstetrics & Gynecology

## 2017-01-28 ENCOUNTER — Encounter: Payer: Self-pay | Admitting: Internal Medicine

## 2017-01-28 DIAGNOSIS — N6489 Other specified disorders of breast: Secondary | ICD-10-CM

## 2017-01-28 LAB — CBC WITH DIFFERENTIAL/PLATELET
BASOS ABS: 0 10*3/uL (ref 0.0–0.2)
Basos: 0 %
EOS (ABSOLUTE): 0.2 10*3/uL (ref 0.0–0.4)
Eos: 2 %
Hematocrit: 41.7 % (ref 34.0–46.6)
Hemoglobin: 14 g/dL (ref 11.1–15.9)
Immature Grans (Abs): 0 10*3/uL (ref 0.0–0.1)
Immature Granulocytes: 0 %
LYMPHS ABS: 2.6 10*3/uL (ref 0.7–3.1)
Lymphs: 27 %
MCH: 30.2 pg (ref 26.6–33.0)
MCHC: 33.6 g/dL (ref 31.5–35.7)
MCV: 90 fL (ref 79–97)
MONOS ABS: 0.4 10*3/uL (ref 0.1–0.9)
Monocytes: 4 %
NEUTROS ABS: 6.3 10*3/uL (ref 1.4–7.0)
Neutrophils: 67 %
Platelets: 354 10*3/uL (ref 150–379)
RBC: 4.63 x10E6/uL (ref 3.77–5.28)
RDW: 13.5 % (ref 12.3–15.4)
WBC: 9.4 10*3/uL (ref 3.4–10.8)

## 2017-01-28 LAB — BASIC METABOLIC PANEL
BUN / CREAT RATIO: 13 (ref 9–23)
BUN: 11 mg/dL (ref 6–24)
CHLORIDE: 100 mmol/L (ref 96–106)
CO2: 22 mmol/L (ref 20–29)
Calcium: 9.7 mg/dL (ref 8.7–10.2)
Creatinine, Ser: 0.82 mg/dL (ref 0.57–1.00)
GFR calc Af Amer: 101 mL/min/{1.73_m2} (ref >59)
GFR calc non Af Amer: 88 mL/min/{1.73_m2} (ref >59)
GLUCOSE: 102 mg/dL — AB (ref 65–99)
POTASSIUM: 4.3 mmol/L (ref 3.5–5.2)
SODIUM: 142 mmol/L (ref 134–144)

## 2017-01-28 LAB — PROTIME-INR
INR: 0.9 (ref 0.8–1.2)
PROTHROMBIN TIME: 9.7 s (ref 9.1–12.0)

## 2017-01-29 DIAGNOSIS — R05 Cough: Secondary | ICD-10-CM | POA: Diagnosis not present

## 2017-01-29 DIAGNOSIS — J0140 Acute pansinusitis, unspecified: Secondary | ICD-10-CM | POA: Diagnosis not present

## 2017-02-03 ENCOUNTER — Ambulatory Visit (INDEPENDENT_AMBULATORY_CARE_PROVIDER_SITE_OTHER): Payer: BLUE CROSS/BLUE SHIELD | Admitting: *Deleted

## 2017-02-03 ENCOUNTER — Ambulatory Visit (HOSPITAL_COMMUNITY)
Admission: RE | Admit: 2017-02-03 | Discharge: 2017-02-04 | Disposition: A | Discharge status: Home / Self Care | Payer: BLUE CROSS/BLUE SHIELD | Source: Ambulatory Visit | Attending: Internal Medicine | Admitting: Internal Medicine

## 2017-02-03 ENCOUNTER — Encounter (HOSPITAL_COMMUNITY): Payer: Self-pay | Admitting: General Practice

## 2017-02-03 ENCOUNTER — Encounter (HOSPITAL_COMMUNITY)
Admission: RE | Disposition: A | Discharge status: Home / Self Care | Payer: Self-pay | Source: Ambulatory Visit | Attending: Internal Medicine

## 2017-02-03 DIAGNOSIS — Z880 Allergy status to penicillin: Secondary | ICD-10-CM | POA: Diagnosis not present

## 2017-02-03 DIAGNOSIS — R55 Syncope and collapse: Secondary | ICD-10-CM | POA: Diagnosis not present

## 2017-02-03 DIAGNOSIS — G43109 Migraine with aura, not intractable, without status migrainosus: Secondary | ICD-10-CM | POA: Diagnosis not present

## 2017-02-03 DIAGNOSIS — Z4509 Encounter for adjustment and management of other cardiac device: Secondary | ICD-10-CM | POA: Insufficient documentation

## 2017-02-03 DIAGNOSIS — Z959 Presence of cardiac and vascular implant and graft, unspecified: Secondary | ICD-10-CM

## 2017-02-03 DIAGNOSIS — R001 Bradycardia, unspecified: Secondary | ICD-10-CM

## 2017-02-03 HISTORY — DX: Migraine, unspecified, not intractable, without status migrainosus: G43.909

## 2017-02-03 HISTORY — PX: LOOP RECORDER REMOVAL: EP1215

## 2017-02-03 HISTORY — PX: PACEMAKER IMPLANT: EP1218

## 2017-02-03 HISTORY — DX: Cardiac murmur, unspecified: R01.1

## 2017-02-03 HISTORY — PX: INSERT / REPLACE / REMOVE PACEMAKER: SUR710

## 2017-02-03 HISTORY — DX: Presence of cardiac pacemaker: Z95.0

## 2017-02-03 LAB — SURGICAL PCR SCREEN
MRSA, PCR: NEGATIVE
Staphylococcus aureus: NEGATIVE

## 2017-02-03 SURGERY — PACEMAKER IMPLANT

## 2017-02-03 MED ORDER — LIDOCAINE HCL (PF) 1 % IJ SOLN
INTRAMUSCULAR | Status: AC
  Filled 2017-02-03: qty 60

## 2017-02-03 MED ORDER — DOXYCYCLINE HYCLATE 100 MG PO TABS
100.0000 mg | ORAL_TABLET | Freq: Two times a day (BID) | ORAL | Status: DC
Start: 2017-02-03 — End: 2017-02-04
  Administered 2017-02-03 – 2017-02-04 (×2): 100 mg via ORAL
  Filled 2017-02-03 (×2): qty 1

## 2017-02-03 MED ORDER — ONDANSETRON HCL 4 MG/2ML IJ SOLN: 4.0000 mg | Freq: Four times a day (QID) | INTRAMUSCULAR | Status: DC | PRN

## 2017-02-03 MED ORDER — FENTANYL CITRATE (PF) 100 MCG/2ML IJ SOLN
INTRAMUSCULAR | Status: AC
  Filled 2017-02-03: qty 2

## 2017-02-03 MED ORDER — VANCOMYCIN HCL IN DEXTROSE 1-5 GM/200ML-% IV SOLN
1000.0000 mg | Freq: Two times a day (BID) | INTRAVENOUS | Status: AC
  Administered 2017-02-03: 1000 mg via INTRAVENOUS
  Filled 2017-02-03: qty 200

## 2017-02-03 MED ORDER — MIDAZOLAM HCL 5 MG/5ML IJ SOLN
INTRAMUSCULAR | Status: DC | PRN
  Administered 2017-02-03 (×3): 1 mg via INTRAVENOUS
  Administered 2017-02-03: 2 mg via INTRAVENOUS
  Administered 2017-02-03: 1 mg via INTRAVENOUS

## 2017-02-03 MED ORDER — SODIUM CHLORIDE 0.9 % IV SOLN
INTRAVENOUS | Status: DC
  Administered 2017-02-03: 07:00:00 via INTRAVENOUS

## 2017-02-03 MED ORDER — SODIUM CHLORIDE 0.9 % IV SOLN
INTRAVENOUS | Status: DC
  Administered 2017-02-03: 06:00:00 via INTRAVENOUS

## 2017-02-03 MED ORDER — VANCOMYCIN HCL IN DEXTROSE 1-5 GM/200ML-% IV SOLN
INTRAVENOUS | Status: AC
  Filled 2017-02-03: qty 200

## 2017-02-03 MED ORDER — HEPARIN (PORCINE) IN NACL 2-0.9 UNIT/ML-% IJ SOLN
INTRAMUSCULAR | Status: AC | PRN
  Administered 2017-02-03: 500 mL

## 2017-02-03 MED ORDER — SODIUM CHLORIDE 0.9 % IV SOLN: INTRAVENOUS | Status: AC

## 2017-02-03 MED ORDER — VANCOMYCIN HCL IN DEXTROSE 1-5 GM/200ML-% IV SOLN
1000.0000 mg | INTRAVENOUS | Status: AC
  Administered 2017-02-03: 1000 mg via INTRAVENOUS
  Filled 2017-02-03: qty 200

## 2017-02-03 MED ORDER — FENTANYL CITRATE (PF) 100 MCG/2ML IJ SOLN
INTRAMUSCULAR | Status: DC | PRN
  Administered 2017-02-03: 50 ug via INTRAVENOUS
  Administered 2017-02-03 (×3): 25 ug via INTRAVENOUS

## 2017-02-03 MED ORDER — HEPARIN (PORCINE) IN NACL 2-0.9 UNIT/ML-% IJ SOLN
INTRAMUSCULAR | Status: AC
  Filled 2017-02-03: qty 500

## 2017-02-03 MED ORDER — MUPIROCIN 2 % EX OINT
TOPICAL_OINTMENT | Freq: Once | CUTANEOUS | Status: AC
  Administered 2017-02-03: 1 via NASAL
  Filled 2017-02-03: qty 22

## 2017-02-03 MED ORDER — SODIUM CHLORIDE 0.9 % IR SOLN
80.0000 mg | Status: AC
  Administered 2017-02-03: 80 mg
  Filled 2017-02-03: qty 2

## 2017-02-03 MED ORDER — MIDAZOLAM HCL 5 MG/5ML IJ SOLN
INTRAMUSCULAR | Status: AC
  Filled 2017-02-03: qty 5

## 2017-02-03 MED ORDER — BENZONATATE 100 MG PO CAPS: 100.0000 mg | ORAL_CAPSULE | Freq: Two times a day (BID) | ORAL | Status: DC | PRN

## 2017-02-03 MED ORDER — ACETAMINOPHEN 325 MG PO TABS
325.0000 mg | ORAL_TABLET | ORAL | Status: DC | PRN
  Administered 2017-02-03 – 2017-02-04 (×3): 650 mg via ORAL
  Filled 2017-02-03 (×3): qty 2

## 2017-02-03 MED ORDER — MUPIROCIN 2 % EX OINT
TOPICAL_OINTMENT | CUTANEOUS | Status: AC
  Administered 2017-02-03: 1 via NASAL
  Filled 2017-02-03: qty 22

## 2017-02-03 MED ORDER — LIDOCAINE HCL (PF) 1 % IJ SOLN
INTRAMUSCULAR | Status: DC | PRN
  Administered 2017-02-03: 45 mL

## 2017-02-03 MED ORDER — GENTAMICIN SULFATE 40 MG/ML IJ SOLN
INTRAMUSCULAR | Status: AC
  Filled 2017-02-03: qty 2

## 2017-02-03 SURGICAL SUPPLY — 8 items
CABLE SURGICAL S-101-97-12 (CABLE) ×2 IMPLANT
HEMOSTAT SURGICEL 2X4 FIBR (HEMOSTASIS) ×1 IMPLANT
LEAD CAPSURE NOVUS 45CM (Lead) ×1 IMPLANT
LEAD CAPSURE NOVUS 5076-52CM (Lead) ×1 IMPLANT
PACEMAKER EDORA 8DR-T MRI (Pacemaker) ×1 IMPLANT
PAD DEFIB LIFELINK (PAD) ×1 IMPLANT
SHEATH CLASSIC 7F (SHEATH) ×2 IMPLANT
TRAY PACEMAKER INSERTION (PACKS) ×1 IMPLANT

## 2017-02-03 NOTE — Progress Notes (Signed)
Carelink Summary Report / Loop Recorder 

## 2017-02-03 NOTE — Discharge Summary (Signed)
ELECTROPHYSIOLOGY PROCEDURE DISCHARGE SUMMARY    Patient ID: Erika [name],  MRN: [id number], DOB/AGE: 1970-12-16 46 y.o.  Admit date: 02/03/2017 Discharge date: 02/04/17  Primary Care Physician: Dettinger, Fransisca Kaufmann, MD  Primary Cardiologist/Electrophysiologist: Dr. Caryl Comes  Primary Discharge Diagnosis:  1. 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 Penicillins Rash    Has patient had a PCN reaction causing immediate rash, facial/tongue/throat swelling, SOB or lightheadedness with hypotension: Yes Has patient had a PCN reaction causing severe rash involving mucus membranes or skin necrosis: No Has patient had a PCN reaction that required hospitalization No Has patient had a PCN reaction occurring within the last 10 years: Yes If all of the above answers are "NO", then may proceed with Cephalosporin use.   . Zomig [Zolmitriptan] Rash     Procedures This Admission:  1.  Implantation of a Biotronik dual chamber PPM on 02/03/17 by Dr Caryl Comes.  The patient received a MRI compatible BIOTRONIK pulse generator serial number [id number], and Medtronic MRI compatible 5076 ventricular lead serial number [id number] and a Medtronic MRI compatible 5076 atrial lead serial number [id number] . 2. Loop explant There were no immediate post procedure complications. 3.  CXR on 02/04/17 demonstrated no pneumothorax status post device implantation.   Brief HPI: Erika [name] is a 46 y.o. female was followed in the outpatient setting for syncope with ILR in place demonstrated gradual sinus slowing and then bradycardia with rates in the 20s for 2 beats, consideration of PPM implantation and additional/alternative therapies were discussed.  Past medical history includes syncope.  The patient has had symptomatic bradycardia without reversible causes identified.  Risks, benefits, and  alternatives to PPM implantation were reviewed with the patient who wished to proceed.   Hospital Course:  The patient was admitted and underwent implantation of a PPM with details as outlined above.  She  was monitored on telemetry overnight which demonstrated SR, occ A pacing.  Left chest was without hematoma or ecchymosis at pacer site or loop explant site.  The device was interrogated and found to be functioning normally.  CXR was obtained and demonstrated no pneumothorax status post device implantation.  Wound care, arm mobility, and restrictions were reviewed with the patient.  The patient was examined by Dr. Caryl Comes and considered stable for discharge to home. She is taking antibiotic for a sinus infection and will complete that course as she was instructed.   Physical Exam: Vitals:   02/03/17 1948 02/03/17 2232 02/04/17 0535 02/04/17 0700  BP: 120/78 109/61 107/64   Pulse: 90 88 81 85  Resp: 17 15 15 20   Temp: 98.2 F (36.8 C) 98.2 F (36.8 C) 97.8 F (36.6 C) (!) 97.5 F (36.4 C)  TempSrc: Oral Oral Oral Oral  SpO2: 97% 98% 98% 99%  Weight:   218 lb 1.6 oz (98.9 kg)   Height:        GEN- The patient is well appearing, alert and oriented x 3 today.   HEENT: normocephalic, atraumatic; sclera clear, conjunctiva pink; hearing intact; oropharynx clear; neck supple, no JVP Lungs- CTA b/l, normal work of breathing.  No wheezes, rales, rhonchi Heart- RRR, no murmurs, rubs or gallops, PMI not laterally displaced GI- soft, non-tender, non-distended Extremities- no clubbing, cyanosis, or edema MS- no significant deformity or atrophy Skin- warm and dry, no rash or lesion, left chest without hematoma/ecchymosis at either ppm or loop  explant site Psych- euthymic mood, full affect Neuro- no gross deficits   Labs:   Lab Results  Component Value Date   WBC 9.4 01/27/2017   HGB 14.0 01/27/2017   HCT 41.7 01/27/2017   MCV 90 01/27/2017   PLT 354 01/27/2017   No results for input(s):  NA, K, CL, CO2, BUN, CREATININE, CALCIUM, PROT, BILITOT, ALKPHOS, ALT, AST, GLUCOSE in the last 168 hours.  Invalid input(s): LABALBU  Discharge Medications:  Allergies as of 02/04/2017      Reactions   Penicillins Rash   Has patient had a PCN reaction causing immediate rash, facial/tongue/throat swelling, SOB or lightheadedness with hypotension: Yes Has patient had a PCN reaction causing severe rash involving mucus membranes or skin necrosis: No Has patient had a PCN reaction that required hospitalization No Has patient had a PCN reaction occurring within the last 10 years: Yes If all of the above answers are "NO", then may proceed with Cephalosporin use.   Zomig [zolmitriptan] Rash      Medication List    TAKE these medications   benzonatate 100 MG capsule Commonly known as:  TESSALON Take 100 mg by mouth 2 (two) times daily as needed for cough.   doxycycline 100 MG tablet Commonly known as:  VIBRA-TABS Take 100 mg by mouth 2 (two) times daily. STARTED ON 01/29/17-02/04/17   PROBIOTIC DAILY PO Take 1 capsule by mouth daily.   scopolamine 1 MG/3DAYS Commonly known as:  TRANSDERM-SCOP Place 1 patch (1.5 mg total) onto the skin every 3 (three) days. What changed:  when to take this  additional instructions       Disposition:  Home Discharge Instructions    Diet - low sodium heart healthy    Complete by:  As directed    Increase activity slowly    Complete by:  As directed      Follow-up Information    [place] [facility] Follow up on 02/17/2017.   Specialty:  Cardiology Why:  10:00AM, wound check Contact information: [address], [address] [place]       Deboraha Sprang, MD Follow up on 05/13/2017.   Specialty:  Cardiology Why:  4:00PM Contact information: [address] [address] [phone]           Duration of Discharge Encounter: Greater than 30 minutes  including physician time.  Signed, Tommye Standard, PA-C 02/04/2017 8:32 AM  Pt sen and examined and instructions given As eating is the trigger for her syncope, she can drive as long as she doesn't eat or drink

## 2017-02-03 NOTE — H&P (Signed)
Patient Care Team: Dettinger, Fransisca Kaufmann, MD as PCP - General (Family Medicine)   HPI  Erika [name] is a 46 y.o. 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 Abnormal uterine bleeding   . Complicated migraine 6/73/4193  . Headache(784.0)    history, otc med prn, last one 2 yrs ago  . Migraine with aura 02/12/2013  . Stress    saw neurologist 6/15 for numbess in face and arm    Past Surgical History:  Procedure Laterality Date  . CESAREAN SECTION     x 2  . CYSTOSCOPY  09/10/2011   Procedure: CYSTOSCOPY;  Surgeon: Felipa Emory, MD;  Location: [facility];  Service: Gynecology;  Laterality: N/A;  . DILATION AND CURETTAGE OF UTERUS    . ENDOMETRIAL ABLATION     failed - done in MD office  . EP IMPLANTABLE DEVICE N/A 02/07/2016   Procedure: Loop Recorder Insertion;  Surgeon: Deboraha Sprang, MD;  Location: [facility];  Service: Cardiovascular;  Laterality: N/A;  . LAPAROSCOPIC TOTAL HYSTERECTOMY     robotic  . TUBAL LIGATION    . WISDOM TOOTH EXTRACTION      Current Facility-Administered Medications  Medication Dose Route Frequency Provider Last Rate Last Dose  . 0.9 %  sodium chloride infusion   Intravenous Continuous Deboraha Sprang, MD 50 mL/hr at 02/03/17 [phone]    . 0.9 %  sodium chloride infusion   Intravenous Continuous Deboraha Sprang, MD      . gentamicin (GARAMYCIN) 80 mg in sodium chloride irrigation 0.9 % 500 mL irrigation  80 mg Irrigation To SSTC Deboraha Sprang, MD      . vancomycin (VANCOCIN) IVPB 1000 mg/200 mL premix  1,000 mg Intravenous To SSTC Deboraha Sprang, MD        Allergies  Allergen Reactions  . Penicillins Rash    Has patient had a PCN reaction causing immediate rash, facial/tongue/throat swelling, SOB or lightheadedness with  hypotension: Yes Has patient had a PCN reaction causing severe rash involving mucus membranes or skin necrosis: No Has patient had a PCN reaction that required hospitalization No Has patient had a PCN reaction occurring within the last 10 years: Yes If all of the above answers are "NO", then may proceed with Cephalosporin use.   . Zomig [Zolmitriptan] Rash      Social History  Substance Use Topics  . Smoking status: Never Smoker  . Smokeless tobacco: Never Used  . Alcohol use 0.0 oz/week     Comment: occ     Family History  Problem Relation Age of Onset  . Diabetes Maternal Grandfather   . Lung cancer Maternal Grandfather   . Diabetes Paternal Grandmother   . Diabetes Paternal Grandfather   . Kidney Stones Father   . Gallbladder disease Mother   . Colon cancer Maternal Aunt 58       second occurence age 7  . Colon cancer Cousin 63       paternal cousin     No current facility-administered medications on file prior to encounter.    Current Outpatient Prescriptions on File Prior to Encounter  Medication Sig Dispense Refill  . Probiotic Product (PROBIOTIC DAILY PO) Take 1 capsule by mouth  daily.    . scopolamine (TRANSDERM-SCOP) 1 MG/3DAYS Place 1 patch (1.5 mg total) onto the skin every 3 (three) days. (Patient taking differently: Place 1 patch onto the skin See admin instructions. EVERY 72 HOURS AS NEEDED FOR NAUSEA/VOMITING) 10 patch 12      Review of Systems negative except from HPI and PMH  Physical Exam BP 116/61   Pulse 90   Temp 98.2 F (36.8 C) (Oral)   Resp 18   Ht 5\' 3"  (1.6 m)   Wt 215 lb (97.5 kg)   LMP 08/20/2011 (Exact Date)   SpO2 98%   BMI 38.09 kg/m  Well developed and well nourished in no acute distress HENT normal E scleral and icterus clear Neck Supple JVP flat; carotids brisk and full Clear to ausculation  Regular rate and rhythm, no murmurs gallops or rub Soft with active bowel sounds No clubbing cyanosis  Edema Alert and oriented,  grossly normal motor and sensory function Skin Warm and Dry    Assessment and  Plan  Deglutition syncope  For pacing   The benefits and risks were reviewed including but not limited to death,  perforation, infection, lead dislodgement and device malfunction.  The patient understands agrees and is willing to proceed.

## 2017-02-04 ENCOUNTER — Encounter (HOSPITAL_COMMUNITY): Payer: Self-pay | Admitting: Internal Medicine

## 2017-02-04 ENCOUNTER — Ambulatory Visit (HOSPITAL_COMMUNITY): Payer: BLUE CROSS/BLUE SHIELD

## 2017-02-04 DIAGNOSIS — J9811 Atelectasis: Secondary | ICD-10-CM | POA: Diagnosis not present

## 2017-02-04 DIAGNOSIS — G43109 Migraine with aura, not intractable, without status migrainosus: Secondary | ICD-10-CM | POA: Diagnosis not present

## 2017-02-04 DIAGNOSIS — Z880 Allergy status to penicillin: Secondary | ICD-10-CM | POA: Diagnosis not present

## 2017-02-04 DIAGNOSIS — Z4509 Encounter for adjustment and management of other cardiac device: Secondary | ICD-10-CM | POA: Diagnosis not present

## 2017-02-04 DIAGNOSIS — R55 Syncope and collapse: Secondary | ICD-10-CM | POA: Diagnosis not present

## 2017-02-04 DIAGNOSIS — R001 Bradycardia, unspecified: Secondary | ICD-10-CM | POA: Diagnosis not present

## 2017-02-04 NOTE — Progress Notes (Signed)
Patient discharged. Given instructions about pacemaker and wound care. Patient stated she understood and signed. No further questions. PIV removed. Patient wheeled out by nurse tech.

## 2017-02-04 NOTE — Plan of Care (Signed)
Problem: Pain Managment: Goal: General experience of comfort will improve Outcome: Progressing Pain managed with Tylenol, soreness 3/10.   Problem: Physical Regulation: Goal: Ability to maintain clinical measurements within normal limits will improve Outcome: Progressing With assist, using sling, gets up to restroom. Down to XRAY this AM.   Problem: Skin Integrity: Goal: Risk for impaired skin integrity will decrease Outcome: Progressing Incisions monitored, no new drainage noted.

## 2017-02-04 NOTE — Discharge Instructions (Signed)
° °  Loop recorder explant site care instructions Keep incision clean and dry for 3 days.  You can remove outer dressing tomorrow. Leave steri-strips (little pieces of tape) on until seen in the office for wound check appointment. Call the office [phone]) for redness, drainage, swelling, or fever.     Supplemental Discharge Instructions for  Pacemaker/Defibrillator Patients  Activity No heavy lifting or vigorous activity with your left/right arm for 6 to 8 weeks.  Do not raise your left/right arm above your head for one week.  Gradually raise your affected arm as drawn below.              02/07/17                    02/08/17                    02/09/17                   02/10/17 __  NO DRIVING for 1 week    ; you may begin driving on  10/18/90 .  WOUND CARE - Keep the wound area clean and dry.  Do not get this area wet, no showers for 3 days; you may shower on  02/07/17  . - The tape/steri-strips on your wound will fall off; do not pull them off.  No bandage is needed on the site.  DO  NOT apply any creams, oils, or ointments to the wound area. - If you notice any drainage or discharge from the wound, any swelling or bruising at the site, or you develop a fever > 101? F after you are discharged home, call the office at once.  Special Instructions - You are still able to use cellular telephones; use the ear opposite the side where you have your pacemaker/defibrillator.  Avoid carrying your cellular phone near your device. - When traveling through airports, show security personnel your identification card to avoid being screened in the metal detectors.  Ask the security personnel to use the hand wand. - Avoid arc welding equipment, MRI testing (magnetic resonance imaging), TENS units (transcutaneous nerve stimulators).  Call the office for questions about other devices. - Avoid electrical appliances that are in poor condition or are not properly grounded. - Microwave ovens are safe to be near  or to operate.  Additional information for defibrillator patients should your device go off: - If your device goes off ONCE and you feel fine afterward, notify the device clinic nurses. - If your device goes off ONCE and you do not feel well afterward, call 911. - If your device goes off TWICE, call 911. - If your device goes off THREE times in one day, call 911.  DO NOT DRIVE YOURSELF OR A FAMILY MEMBER WITH A DEFIBRILLATOR TO THE HOSPITAL--CALL 911.

## 2017-02-06 ENCOUNTER — Encounter: Payer: Self-pay | Admitting: Internal Medicine

## 2017-02-14 ENCOUNTER — Ambulatory Visit (INDEPENDENT_AMBULATORY_CARE_PROVIDER_SITE_OTHER): Payer: BLUE CROSS/BLUE SHIELD | Admitting: *Deleted

## 2017-02-14 DIAGNOSIS — Z950 Presence of cardiac pacemaker: Secondary | ICD-10-CM

## 2017-02-14 DIAGNOSIS — R55 Syncope and collapse: Secondary | ICD-10-CM | POA: Diagnosis not present

## 2017-02-14 LAB — CUP PACEART INCLINIC DEVICE CHECK
Date Time Interrogation Session: 20180727151700
Implantable Lead Implant Date: 20180716
Implantable Lead Location: 753860
Implantable Pulse Generator Implant Date: 20180716
Lead Channel Impedance Value: 546 Ohm
Lead Channel Pacing Threshold Amplitude: 0.6 V
Lead Channel Pacing Threshold Pulse Width: 0.4 ms
Lead Channel Sensing Intrinsic Amplitude: 14.3 mV
Lead Channel Sensing Intrinsic Amplitude: 2.4 mV
Lead Channel Setting Pacing Pulse Width: 0.4 ms
MDC IDC LEAD IMPLANT DT: 20180716
MDC IDC LEAD LOCATION: 753859
MDC IDC MSMT LEADCHNL RA IMPEDANCE VALUE: 370 Ohm
MDC IDC MSMT LEADCHNL RA PACING THRESHOLD PULSEWIDTH: 0.4 ms
MDC IDC MSMT LEADCHNL RV PACING THRESHOLD AMPLITUDE: 0.7 V
MDC IDC PG SERIAL: 69161362
MDC IDC SET LEADCHNL RA PACING AMPLITUDE: 3 V
MDC IDC SET LEADCHNL RV PACING AMPLITUDE: 3 V
MDC IDC STAT BRADY RA PERCENT PACED: 28 %
MDC IDC STAT BRADY RV PERCENT PACED: 0 %
Pulse Gen Model: 407145

## 2017-02-14 NOTE — Progress Notes (Signed)
Wound check appointment with industry present. Dermabond removed. Wound without redness or edema. Incision edges approximated, wound well healed. Normal device function. Thresholds, sensing, and impedances consistent with implant measurements. Device programmed at 3.0V for extra safety margin until 3 month visit. Histogram distribution appropriate for patient and level of activity. No mode switches or high ventricular rates noted. Patient educated about wound care, arm mobility, lifting restrictions. ROV with SK on 05/16/17.

## 2017-02-17 ENCOUNTER — Ambulatory Visit: Payer: BLUE CROSS/BLUE SHIELD

## 2017-02-17 LAB — CUP PACEART REMOTE DEVICE CHECK
Date Time Interrogation Session: 20180714194915
Implantable Lead Implant Date: 20180716
Implantable Lead Location: 753859
Implantable Lead Location: 753860
Implantable Lead Model: 5076
MDC IDC LEAD IMPLANT DT: 20180716
MDC IDC PG IMPLANT DT: 20170719

## 2017-02-17 NOTE — Progress Notes (Signed)
Carelink summary report received. Battery status OK. Normal device function. No new symptom episodes, tachy episodes, brady, or pause episodes. No new AF episodes. Monthly summary reports and ROV/PRN 

## 2017-02-19 ENCOUNTER — Encounter: Payer: Self-pay | Admitting: *Deleted

## 2017-03-21 ENCOUNTER — Encounter: Payer: BLUE CROSS/BLUE SHIELD | Admitting: Internal Medicine

## 2017-03-26 ENCOUNTER — Telehealth: Payer: Self-pay | Admitting: *Deleted

## 2017-03-26 NOTE — Telephone Encounter (Signed)
Patient in 04 recall for 02/2017. Patient needs follow up breast imagining. Please contact patient regarding scheduling Thanks

## 2017-03-27 NOTE — Telephone Encounter (Signed)
Call to patient. Message given to patient as seen below. Patient states that she has not had follow up breast imaging yet because she had a pacemaker placed February 03, 2017 and she was told to wait until after September 16th to have MMG so that the area would be completely healed. Patient states she will schedule MMG for the beginning of October as she will be traveling in late September.   Routing to provider for review.

## 2017-03-27 NOTE — Telephone Encounter (Signed)
Please see message below regarding 04 recall. Would you like me to extend recall? Thanks

## 2017-03-28 NOTE — Telephone Encounter (Signed)
Ok to extend recall.  Thanks.

## 2017-03-31 NOTE — Telephone Encounter (Signed)
Recall extended -eh

## 2017-04-29 ENCOUNTER — Ambulatory Visit
Admission: RE | Admit: 2017-04-29 | Discharge: 2017-04-29 | Disposition: A | Discharge status: Home / Self Care | Payer: BLUE CROSS/BLUE SHIELD | Source: Ambulatory Visit | Attending: Obstetrics & Gynecology | Admitting: Obstetrics & Gynecology

## 2017-04-29 ENCOUNTER — Ambulatory Visit: Payer: BLUE CROSS/BLUE SHIELD

## 2017-04-29 ENCOUNTER — Other Ambulatory Visit: Payer: Self-pay | Admitting: Obstetrics & Gynecology

## 2017-04-29 DIAGNOSIS — N6489 Other specified disorders of breast: Secondary | ICD-10-CM

## 2017-04-29 DIAGNOSIS — R928 Other abnormal and inconclusive findings on diagnostic imaging of breast: Secondary | ICD-10-CM | POA: Diagnosis not present

## 2017-05-05 ENCOUNTER — Encounter: Payer: Self-pay | Admitting: Internal Medicine

## 2017-05-13 ENCOUNTER — Encounter: Payer: BLUE CROSS/BLUE SHIELD | Admitting: Internal Medicine

## 2017-05-16 ENCOUNTER — Encounter: Payer: BLUE CROSS/BLUE SHIELD | Admitting: Internal Medicine

## 2017-05-28 ENCOUNTER — Telehealth: Payer: Self-pay

## 2017-05-28 ENCOUNTER — Encounter: Payer: Self-pay | Admitting: Internal Medicine

## 2017-05-28 DIAGNOSIS — M9903 Segmental and somatic dysfunction of lumbar region: Secondary | ICD-10-CM | POA: Diagnosis not present

## 2017-05-28 DIAGNOSIS — M9902 Segmental and somatic dysfunction of thoracic region: Secondary | ICD-10-CM | POA: Diagnosis not present

## 2017-05-28 DIAGNOSIS — M9904 Segmental and somatic dysfunction of sacral region: Secondary | ICD-10-CM | POA: Diagnosis not present

## 2017-05-28 DIAGNOSIS — M5137 Other intervertebral disc degeneration, lumbosacral region: Secondary | ICD-10-CM | POA: Diagnosis not present

## 2017-05-28 NOTE — Telephone Encounter (Signed)
LVM to discuss patient concerns regarding incision - see patient advice request.

## 2017-05-29 DIAGNOSIS — M9903 Segmental and somatic dysfunction of lumbar region: Secondary | ICD-10-CM | POA: Diagnosis not present

## 2017-05-29 DIAGNOSIS — M9902 Segmental and somatic dysfunction of thoracic region: Secondary | ICD-10-CM | POA: Diagnosis not present

## 2017-05-29 DIAGNOSIS — M5137 Other intervertebral disc degeneration, lumbosacral region: Secondary | ICD-10-CM | POA: Diagnosis not present

## 2017-05-29 DIAGNOSIS — M9904 Segmental and somatic dysfunction of sacral region: Secondary | ICD-10-CM | POA: Diagnosis not present

## 2017-06-09 DIAGNOSIS — M9903 Segmental and somatic dysfunction of lumbar region: Secondary | ICD-10-CM | POA: Diagnosis not present

## 2017-06-09 DIAGNOSIS — M9904 Segmental and somatic dysfunction of sacral region: Secondary | ICD-10-CM | POA: Diagnosis not present

## 2017-06-09 DIAGNOSIS — M5137 Other intervertebral disc degeneration, lumbosacral region: Secondary | ICD-10-CM | POA: Diagnosis not present

## 2017-06-09 DIAGNOSIS — M9902 Segmental and somatic dysfunction of thoracic region: Secondary | ICD-10-CM | POA: Diagnosis not present

## 2017-06-10 DIAGNOSIS — M5137 Other intervertebral disc degeneration, lumbosacral region: Secondary | ICD-10-CM | POA: Diagnosis not present

## 2017-06-10 DIAGNOSIS — M9902 Segmental and somatic dysfunction of thoracic region: Secondary | ICD-10-CM | POA: Diagnosis not present

## 2017-06-10 DIAGNOSIS — M9904 Segmental and somatic dysfunction of sacral region: Secondary | ICD-10-CM | POA: Diagnosis not present

## 2017-06-10 DIAGNOSIS — M9903 Segmental and somatic dysfunction of lumbar region: Secondary | ICD-10-CM | POA: Diagnosis not present

## 2017-06-11 DIAGNOSIS — M9902 Segmental and somatic dysfunction of thoracic region: Secondary | ICD-10-CM | POA: Diagnosis not present

## 2017-06-11 DIAGNOSIS — M9904 Segmental and somatic dysfunction of sacral region: Secondary | ICD-10-CM | POA: Diagnosis not present

## 2017-06-11 DIAGNOSIS — M9903 Segmental and somatic dysfunction of lumbar region: Secondary | ICD-10-CM | POA: Diagnosis not present

## 2017-06-11 DIAGNOSIS — M5137 Other intervertebral disc degeneration, lumbosacral region: Secondary | ICD-10-CM | POA: Diagnosis not present

## 2017-06-16 DIAGNOSIS — M9904 Segmental and somatic dysfunction of sacral region: Secondary | ICD-10-CM | POA: Diagnosis not present

## 2017-06-16 DIAGNOSIS — M9902 Segmental and somatic dysfunction of thoracic region: Secondary | ICD-10-CM | POA: Diagnosis not present

## 2017-06-16 DIAGNOSIS — M5137 Other intervertebral disc degeneration, lumbosacral region: Secondary | ICD-10-CM | POA: Diagnosis not present

## 2017-06-16 DIAGNOSIS — M9903 Segmental and somatic dysfunction of lumbar region: Secondary | ICD-10-CM | POA: Diagnosis not present

## 2017-06-18 DIAGNOSIS — M9902 Segmental and somatic dysfunction of thoracic region: Secondary | ICD-10-CM | POA: Diagnosis not present

## 2017-06-18 DIAGNOSIS — M9903 Segmental and somatic dysfunction of lumbar region: Secondary | ICD-10-CM | POA: Diagnosis not present

## 2017-06-18 DIAGNOSIS — M5137 Other intervertebral disc degeneration, lumbosacral region: Secondary | ICD-10-CM | POA: Diagnosis not present

## 2017-06-18 DIAGNOSIS — M9904 Segmental and somatic dysfunction of sacral region: Secondary | ICD-10-CM | POA: Diagnosis not present

## 2017-06-20 DIAGNOSIS — M9904 Segmental and somatic dysfunction of sacral region: Secondary | ICD-10-CM | POA: Diagnosis not present

## 2017-06-20 DIAGNOSIS — M9902 Segmental and somatic dysfunction of thoracic region: Secondary | ICD-10-CM | POA: Diagnosis not present

## 2017-06-20 DIAGNOSIS — M5137 Other intervertebral disc degeneration, lumbosacral region: Secondary | ICD-10-CM | POA: Diagnosis not present

## 2017-06-20 DIAGNOSIS — M9903 Segmental and somatic dysfunction of lumbar region: Secondary | ICD-10-CM | POA: Diagnosis not present

## 2017-06-23 DIAGNOSIS — M9903 Segmental and somatic dysfunction of lumbar region: Secondary | ICD-10-CM | POA: Diagnosis not present

## 2017-06-23 DIAGNOSIS — M9902 Segmental and somatic dysfunction of thoracic region: Secondary | ICD-10-CM | POA: Diagnosis not present

## 2017-06-23 DIAGNOSIS — M5137 Other intervertebral disc degeneration, lumbosacral region: Secondary | ICD-10-CM | POA: Diagnosis not present

## 2017-06-23 DIAGNOSIS — M9904 Segmental and somatic dysfunction of sacral region: Secondary | ICD-10-CM | POA: Diagnosis not present

## 2017-06-26 DIAGNOSIS — M9904 Segmental and somatic dysfunction of sacral region: Secondary | ICD-10-CM | POA: Diagnosis not present

## 2017-06-26 DIAGNOSIS — M9902 Segmental and somatic dysfunction of thoracic region: Secondary | ICD-10-CM | POA: Diagnosis not present

## 2017-06-26 DIAGNOSIS — M5137 Other intervertebral disc degeneration, lumbosacral region: Secondary | ICD-10-CM | POA: Diagnosis not present

## 2017-06-26 DIAGNOSIS — M9903 Segmental and somatic dysfunction of lumbar region: Secondary | ICD-10-CM | POA: Diagnosis not present

## 2017-07-02 DIAGNOSIS — M9903 Segmental and somatic dysfunction of lumbar region: Secondary | ICD-10-CM | POA: Diagnosis not present

## 2017-07-02 DIAGNOSIS — M9904 Segmental and somatic dysfunction of sacral region: Secondary | ICD-10-CM | POA: Diagnosis not present

## 2017-07-02 DIAGNOSIS — M9902 Segmental and somatic dysfunction of thoracic region: Secondary | ICD-10-CM | POA: Diagnosis not present

## 2017-07-02 DIAGNOSIS — M5137 Other intervertebral disc degeneration, lumbosacral region: Secondary | ICD-10-CM | POA: Diagnosis not present

## 2017-07-03 DIAGNOSIS — M9903 Segmental and somatic dysfunction of lumbar region: Secondary | ICD-10-CM | POA: Diagnosis not present

## 2017-07-03 DIAGNOSIS — M9902 Segmental and somatic dysfunction of thoracic region: Secondary | ICD-10-CM | POA: Diagnosis not present

## 2017-07-03 DIAGNOSIS — M5137 Other intervertebral disc degeneration, lumbosacral region: Secondary | ICD-10-CM | POA: Diagnosis not present

## 2017-07-03 DIAGNOSIS — M9904 Segmental and somatic dysfunction of sacral region: Secondary | ICD-10-CM | POA: Diagnosis not present

## 2017-07-07 DIAGNOSIS — M9904 Segmental and somatic dysfunction of sacral region: Secondary | ICD-10-CM | POA: Diagnosis not present

## 2017-07-07 DIAGNOSIS — M9903 Segmental and somatic dysfunction of lumbar region: Secondary | ICD-10-CM | POA: Diagnosis not present

## 2017-07-07 DIAGNOSIS — M9902 Segmental and somatic dysfunction of thoracic region: Secondary | ICD-10-CM | POA: Diagnosis not present

## 2017-07-07 DIAGNOSIS — M5137 Other intervertebral disc degeneration, lumbosacral region: Secondary | ICD-10-CM | POA: Diagnosis not present

## 2017-07-10 DIAGNOSIS — M9903 Segmental and somatic dysfunction of lumbar region: Secondary | ICD-10-CM | POA: Diagnosis not present

## 2017-07-10 DIAGNOSIS — M9904 Segmental and somatic dysfunction of sacral region: Secondary | ICD-10-CM | POA: Diagnosis not present

## 2017-07-10 DIAGNOSIS — M5137 Other intervertebral disc degeneration, lumbosacral region: Secondary | ICD-10-CM | POA: Diagnosis not present

## 2017-07-10 DIAGNOSIS — M9902 Segmental and somatic dysfunction of thoracic region: Secondary | ICD-10-CM | POA: Diagnosis not present

## 2017-07-23 ENCOUNTER — Ambulatory Visit: Payer: BLUE CROSS/BLUE SHIELD | Admitting: Internal Medicine

## 2017-07-23 ENCOUNTER — Encounter: Payer: Self-pay | Admitting: Internal Medicine

## 2017-07-23 VITALS — BP 118/74 | HR 79 | Ht 63.0 in | Wt 217.4 lb

## 2017-07-23 DIAGNOSIS — R55 Syncope and collapse: Secondary | ICD-10-CM | POA: Diagnosis not present

## 2017-07-23 DIAGNOSIS — Z950 Presence of cardiac pacemaker: Secondary | ICD-10-CM | POA: Diagnosis not present

## 2017-07-23 LAB — CUP PACEART INCLINIC DEVICE CHECK
Date Time Interrogation Session: 20190102145041
Implantable Lead Implant Date: 20180716
Implantable Lead Location: 753860
Implantable Lead Model: 5076
Implantable Pulse Generator Implant Date: 20180716
Lead Channel Pacing Threshold Amplitude: 0.6 V
Lead Channel Pacing Threshold Amplitude: 0.6 V
Lead Channel Pacing Threshold Pulse Width: 0.4 ms
Lead Channel Pacing Threshold Pulse Width: 0.4 ms
Lead Channel Sensing Intrinsic Amplitude: 4.2 mV
Lead Channel Sensing Intrinsic Amplitude: 4.3 mV
Lead Channel Setting Pacing Amplitude: 2.4 V
MDC IDC LEAD IMPLANT DT: 20180716
MDC IDC LEAD LOCATION: 753859
MDC IDC MSMT LEADCHNL RA IMPEDANCE VALUE: 390 Ohm
MDC IDC MSMT LEADCHNL RA PACING THRESHOLD AMPLITUDE: 0.6 V
MDC IDC MSMT LEADCHNL RA PACING THRESHOLD AMPLITUDE: 0.6 V
MDC IDC MSMT LEADCHNL RA PACING THRESHOLD PULSEWIDTH: 0.4 ms
MDC IDC MSMT LEADCHNL RV IMPEDANCE VALUE: 604 Ohm
MDC IDC MSMT LEADCHNL RV PACING THRESHOLD AMPLITUDE: 0.6 V
MDC IDC MSMT LEADCHNL RV PACING THRESHOLD PULSEWIDTH: 0.4 ms
MDC IDC MSMT LEADCHNL RV PACING THRESHOLD PULSEWIDTH: 0.4 ms
MDC IDC MSMT LEADCHNL RV SENSING INTR AMPL: 17.6 mV
MDC IDC MSMT LEADCHNL RV SENSING INTR AMPL: 18.1 mV
MDC IDC SET LEADCHNL RV PACING AMPLITUDE: 2.4 V
MDC IDC SET LEADCHNL RV PACING PULSEWIDTH: 0.4 ms
Pulse Gen Model: 407145
Pulse Gen Serial Number: 69161362

## 2017-07-23 NOTE — Patient Instructions (Signed)
Medication Instructions: Your physician recommends that you continue on your current medications as directed. Please refer to the Current Medication list given to you today.  Labwork: None Ordered  Procedures/Testing: None Ordered  Follow-Up: Your physician wants you to follow-up in: 9 MONTHS with Dr. Caryl Comes. You will receive a reminder letter in the mail two months in advance. If you don't receive a letter, please call our office to schedule the follow-up appointment.  Remote monitoring is used to monitor your Pacemaker from home. This monitoring reduces the number of office visits required to check your device to one time per year. It allows [place] to keep an eye on the functioning of your device to ensure it is working properly. You are scheduled for a device check from home on 10/22/17. You may send your transmission at any time that day. If you have a wireless device, the transmission will be sent automatically. After your physician reviews your transmission, you will receive a postcard with your next transmission date.   If you need a refill on your cardiac medications before your next appointment, please call your pharmacy.

## 2017-07-23 NOTE — Progress Notes (Signed)
Patient Care Team: Dettinger, Fransisca Kaufmann, MD as PCP - General (Family Medicine)   HPI  Erika [name] is a 47 y.o. female Seen in followup for syncope and ILR implantation 7/17   She underwent pacing with a Biotronik CLS device 9/18 for recurrent syncope.  She has had interval presyncope but no syncope.  She has had some episodes of lightheadedness that is been aborted by sitting.  We reviewed the issues of volume depletion  She has had episodes of pounding.  These occur when she is excited.  Device interrogation has demonstrated PMT.   Records and Results Reviewed  Past Medical History:  Diagnosis Date  . Abnormal uterine bleeding   . Complicated migraine 7/85/8850  . Heart murmur   . Migraine    "a few/year" (02/03/2017)  . Migraine with aura 02/12/2013  . Presence of permanent cardiac pacemaker   . Stress    saw neurologist 6/15 for numbess in face and arm    Past Surgical History:  Procedure Laterality Date  . CESAREAN SECTION  1994; 1997  . CYSTOSCOPY  09/10/2011   Procedure: CYSTOSCOPY;  Surgeon: Felipa Emory, MD;  Location: [facility];  Service: Gynecology;  Laterality: N/A;  . DILATION AND CURETTAGE OF UTERUS    . ENDOMETRIAL ABLATION     failed - done in MD office  . EP IMPLANTABLE DEVICE N/A 02/07/2016   Procedure: Loop Recorder Insertion;  Surgeon: Deboraha Sprang, MD;  Location: [facility];  Service: Cardiovascular;  Laterality: N/A;  . INSERT / REPLACE / REMOVE PACEMAKER  02/03/2017  . LAPAROSCOPIC TOTAL HYSTERECTOMY     robotic  . LOOP RECORDER REMOVAL  02/03/2017  . LOOP RECORDER REMOVAL N/A 02/03/2017   Procedure: Loop Recorder Removal;  Surgeon: Deboraha Sprang, MD;  Location: [facility];  Service: Cardiovascular;  Laterality: N/A;  . PACEMAKER IMPLANT N/A 02/03/2017   Procedure: Pacemaker Implant;  Surgeon: Deboraha Sprang, MD;  Location: [facility];  Service: Cardiovascular;  Laterality: N/A;  . TUBAL LIGATION  1997  . WISDOM  TOOTH EXTRACTION      Current Outpatient Medications  Medication Sig Dispense Refill  . benzonatate (TESSALON) 100 MG capsule Take 100 mg by mouth 2 (two) times daily as needed for cough.  0  . doxycycline (VIBRA-TABS) 100 MG tablet Take 100 mg by mouth 2 (two) times daily. STARTED ON 01/29/17-02/04/17  0  . Probiotic Product (PROBIOTIC DAILY PO) Take 1 capsule by mouth daily.    Marland Kitchen scopolamine (TRANSDERM-SCOP) 1 MG/3DAYS Place 1 patch (1.5 mg total) onto the skin every 3 (three) days. (Patient taking differently: Place 1 patch onto the skin See admin instructions. EVERY 72 HOURS AS NEEDED FOR NAUSEA/VOMITING) 10 patch 12   No current facility-administered medications for this visit.     Allergies  Allergen Reactions  . Penicillins Rash    Has patient had a PCN reaction causing immediate rash, facial/tongue/throat swelling, SOB or lightheadedness with hypotension: Yes Has patient had a PCN reaction causing severe rash involving mucus membranes or skin necrosis: No Has patient had a PCN reaction that required hospitalization No Has patient had a PCN reaction occurring within the last 10 years: Yes If all of the above answers are "NO", then may proceed with Cephalosporin use.   . Zomig [Zolmitriptan] Rash      Review of Systems negative except from HPI and PMH  Physical Exam BP 118/74   Pulse 79  Ht 5\' 3"  (1.6 m)   Wt 217 lb 6.4 oz (98.6 kg)   LMP 08/20/2011 (Exact Date)   SpO2 96%   BMI 38.51 kg/m  Well developed and nourished in no acute distress HENT normal Device pocket well healed; without hematoma or erythema.  There is no tethering small keloid Neck supple with JVP-flat Clear Regular rate and rhythm, no murmurs or gallops Abd-soft with active BS No Clubbing cyanosis edema Skin-warm and dry A & Oriented  Grossly normal sensory and motor function   Sinus at 85 Intervals 13/09/40 Otherwise normal  Assessment and  Plan  Syncope-Deglutition   Pacemaker  -biotronik   Pacemaker mediated Tachycardia  Keloid  Palpitations   No interval syncope.  She has had presyncope which was similar to her prior syncope.  She has had other episodes of lightheadedness.  CLS may have aborted these events.  Pounding sensations may be related to pacemaker mediated tachycardia.  With Biotronik, 0% has not been 0 but rather less than 1%.    Suggested she could use low-dose hydrocortisone on a keloid in addition to the mineral oils

## 2017-09-17 ENCOUNTER — Other Ambulatory Visit: Payer: Self-pay | Admitting: Obstetrics & Gynecology

## 2017-09-17 DIAGNOSIS — N6489 Other specified disorders of breast: Secondary | ICD-10-CM

## 2017-10-22 ENCOUNTER — Ambulatory Visit (INDEPENDENT_AMBULATORY_CARE_PROVIDER_SITE_OTHER): Payer: BLUE CROSS/BLUE SHIELD | Admitting: *Deleted

## 2017-10-22 DIAGNOSIS — R55 Syncope and collapse: Secondary | ICD-10-CM

## 2017-10-22 NOTE — Progress Notes (Signed)
Remote pacemaker transmission.   

## 2017-10-23 ENCOUNTER — Encounter: Payer: Self-pay | Admitting: Cardiology

## 2017-10-24 LAB — CUP PACEART REMOTE DEVICE CHECK
Battery Remaining Percentage: 90 %
Brady Statistic AP VS Percent: 35 %
Brady Statistic AS VP Percent: 0 %
Brady Statistic AS VS Percent: 65 %
Brady Statistic RA Percent Paced: 35 %
Brady Statistic RV Percent Paced: 0 %
Implantable Lead Implant Date: 20180716
Implantable Lead Location: 753859
Implantable Lead Model: 5076
Implantable Lead Model: 5076
Implantable Pulse Generator Implant Date: 20180716
Lead Channel Impedance Value: 367 Ohm
Lead Channel Pacing Threshold Amplitude: 0.7 V
Lead Channel Pacing Threshold Pulse Width: 0.4 ms
Lead Channel Pacing Threshold Pulse Width: 0.4 ms
Lead Channel Setting Pacing Amplitude: 2.4 V
Lead Channel Setting Pacing Amplitude: 2.4 V
Lead Channel Setting Pacing Pulse Width: 0.4 ms
MDC IDC LEAD IMPLANT DT: 20180716
MDC IDC LEAD LOCATION: 753860
MDC IDC MSMT LEADCHNL RA PACING THRESHOLD AMPLITUDE: 0.6 V
MDC IDC MSMT LEADCHNL RV IMPEDANCE VALUE: 547 Ohm
MDC IDC PG SERIAL: 69161362
MDC IDC SESS DTM: 20190405045048
MDC IDC STAT BRADY AP VP PERCENT: 0 %

## 2017-10-29 ENCOUNTER — Other Ambulatory Visit: Payer: BLUE CROSS/BLUE SHIELD

## 2017-10-30 ENCOUNTER — Ambulatory Visit
Admission: RE | Admit: 2017-10-30 | Discharge: 2017-10-30 | Disposition: A | Discharge status: Home / Self Care | Payer: BLUE CROSS/BLUE SHIELD | Source: Ambulatory Visit | Attending: Obstetrics & Gynecology | Admitting: Obstetrics & Gynecology

## 2017-10-30 ENCOUNTER — Ambulatory Visit: Payer: BLUE CROSS/BLUE SHIELD

## 2017-10-30 DIAGNOSIS — R928 Other abnormal and inconclusive findings on diagnostic imaging of breast: Secondary | ICD-10-CM | POA: Diagnosis not present

## 2017-10-30 DIAGNOSIS — N6489 Other specified disorders of breast: Secondary | ICD-10-CM

## 2017-11-30 ENCOUNTER — Emergency Department (HOSPITAL_BASED_OUTPATIENT_CLINIC_OR_DEPARTMENT_OTHER): Payer: BLUE CROSS/BLUE SHIELD

## 2017-11-30 ENCOUNTER — Other Ambulatory Visit: Payer: Self-pay

## 2017-11-30 ENCOUNTER — Encounter (HOSPITAL_BASED_OUTPATIENT_CLINIC_OR_DEPARTMENT_OTHER): Payer: Self-pay | Admitting: Emergency Medicine

## 2017-11-30 ENCOUNTER — Emergency Department (HOSPITAL_BASED_OUTPATIENT_CLINIC_OR_DEPARTMENT_OTHER)
Admission: EM | Admit: 2017-11-30 | Discharge: 2017-11-30 | Disposition: A | Discharge status: Home / Self Care | Payer: BLUE CROSS/BLUE SHIELD | Attending: Emergency Medicine | Admitting: Emergency Medicine

## 2017-11-30 DIAGNOSIS — I471 Supraventricular tachycardia: Secondary | ICD-10-CM | POA: Diagnosis not present

## 2017-11-30 DIAGNOSIS — Z79899 Other long term (current) drug therapy: Secondary | ICD-10-CM | POA: Diagnosis not present

## 2017-11-30 DIAGNOSIS — R0602 Shortness of breath: Secondary | ICD-10-CM | POA: Diagnosis not present

## 2017-11-30 DIAGNOSIS — R079 Chest pain, unspecified: Secondary | ICD-10-CM | POA: Diagnosis not present

## 2017-11-30 DIAGNOSIS — R0789 Other chest pain: Secondary | ICD-10-CM | POA: Diagnosis not present

## 2017-11-30 DIAGNOSIS — Z950 Presence of cardiac pacemaker: Secondary | ICD-10-CM | POA: Diagnosis not present

## 2017-11-30 LAB — CBC
HEMATOCRIT: 40.8 % (ref 36.0–46.0)
HEMOGLOBIN: 14.6 g/dL (ref 12.0–15.0)
MCH: 31.5 pg (ref 26.0–34.0)
MCHC: 35.8 g/dL (ref 30.0–36.0)
MCV: 87.9 fL (ref 78.0–100.0)
Platelets: 252 10*3/uL (ref 150–400)
RBC: 4.64 MIL/uL (ref 3.87–5.11)
RDW: 12.4 % (ref 11.5–15.5)
WBC: 9.4 10*3/uL (ref 4.0–10.5)

## 2017-11-30 LAB — BASIC METABOLIC PANEL
ANION GAP: 9 (ref 5–15)
BUN: 13 mg/dL (ref 6–20)
CHLORIDE: 107 mmol/L (ref 101–111)
CO2: 23 mmol/L (ref 22–32)
Calcium: 9.4 mg/dL (ref 8.9–10.3)
Creatinine, Ser: 0.84 mg/dL (ref 0.44–1.00)
GFR calc Af Amer: >60 mL/min (ref >60)
GFR calc non Af Amer: >60 mL/min (ref >60)
GLUCOSE: 104 mg/dL — AB (ref 65–99)
POTASSIUM: 3.7 mmol/L (ref 3.5–5.1)
Sodium: 139 mmol/L (ref 135–145)

## 2017-11-30 LAB — TROPONIN I: Troponin I: <0.03 ng/mL (ref <0.03)

## 2017-11-30 MED ORDER — ASPIRIN 81 MG PO CHEW
324.0000 mg | CHEWABLE_TABLET | Freq: Once | ORAL | Status: AC
Start: 2017-11-30 — End: 2017-11-30
  Administered 2017-11-30: 324 mg via ORAL
  Filled 2017-11-30: qty 4

## 2017-11-30 NOTE — ED Notes (Signed)
Pt states she had a sudden onset of feeling like she couldn't breath with CP, numbness around mouth and bilateral arms. Pt reports then her hands started to "draw up." Pt reports she is under stress and was having a stressful conversation at the time. Pt reports after she was able to calm down the numbness and hand cramps started to relieve, but the CP remained. Pt describes the CP as sharp and is worse with certain movements. CP is reproducible to palpation. Pt also endorses some associated nausea.   Note pt began to hyperventilate when asked about what was going on when everything started. This RN able to coach pt in slowing respirations down.

## 2017-11-30 NOTE — Discharge Instructions (Addendum)
Contact a health care provider if: °You have episodes of SVT more often than before. °Episodes of SVT last longer than before. °Vagus nerve stimulation is no longer helping. °You have new symptoms. °Get help right away if: °You have chest pain. °Your symptoms get worse. °You have trouble breathing. °You have an episode of SVT that lasts longer than 20 minutes. °You faint. °

## 2017-11-30 NOTE — ED Notes (Signed)
Pt on cardiac monitor and auto VS 

## 2017-11-30 NOTE — ED Triage Notes (Signed)
Patient states that she started to have chest pain about 15 minutes ago while riding in the car, she states that she was upset. Patient continues to be tearful in triage and hyperventalting  - patient states that she is having mid chest pain

## 2017-11-30 NOTE — ED Notes (Signed)
"  while asking the patient "safety questions" she bursts out in tears and states " it was my fault" - the patient then states " no one is going to hurt me, wheres my husband" Patient remains hysterically crying until triage complete

## 2017-11-30 NOTE — ED Notes (Signed)
Talked to the [employer] representative and she will arrive in approx 45 minutes to interrogate the pt's pacemaker.   Pt resting comfortably at this time. Pt updated on plan.

## 2017-11-30 NOTE — ED Provider Notes (Signed)
[facility] EMERGENCY DEPARTMENT Provider Note   CSN: [id number] Arrival date & time: 11/30/17  1306     History   Chief Complaint Chief Complaint  Patient presents with  . Chest Pain    HPI Erika [name] is a 47 y.o. female who presents the emergency department with chief complaint of chest pain.  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  She has no other contributing past medical history or risk factors for coronary artery disease. The patient states that she was extremely upset when she had onset of sharp chest pain, associated shortness of breath.  She felt hysterical and was hyperventilating.  She noticed her hands and feet went numb and began cramping.  She denies a previous history of panic attacks.  The patient does not divulge the reason for her emotional distress.  Her numbness and tingling have resolved.  She still has mild chest pain.  She denies any stimulant use, smoking history, history of chest pain.  Denies pleuritic chest pain, exogenous estrogen use, recent trauma, history of cancer, history of PE or DVT, recent confinement.  HPI  Past Medical History:  Diagnosis Date  . Abnormal uterine bleeding   . Complicated migraine 12/07/8414  . Heart murmur   . Migraine    "a few/year" (02/03/2017)  . Migraine with aura 02/12/2013  . Presence of permanent cardiac pacemaker   . Stress    saw neurologist 6/15 for numbess in face and arm    Patient Active Problem List   Diagnosis Date Noted  . 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 Syncope 02/07/2016  . Migraine with aura 02/12/2013  . Complicated migraine 60/63/0160    Past Surgical History:  Procedure Laterality Date  . CESAREAN SECTION  1994; 1997  . CYSTOSCOPY  09/10/2011   Procedure: CYSTOSCOPY;  Surgeon: Felipa Emory, MD;  Location: [facility];  Service: Gynecology;  Laterality: N/A;  . DILATION AND CURETTAGE OF UTERUS    . ENDOMETRIAL ABLATION     failed - done  in MD office  . EP IMPLANTABLE DEVICE N/A 02/07/2016   Procedure: Loop Recorder Insertion;  Surgeon: Deboraha Sprang, MD;  Location: [facility];  Service: Cardiovascular;  Laterality: N/A;  . INSERT / REPLACE / REMOVE PACEMAKER  02/03/2017  . LAPAROSCOPIC TOTAL HYSTERECTOMY     robotic  . LOOP RECORDER REMOVAL  02/03/2017  . LOOP RECORDER REMOVAL N/A 02/03/2017   Procedure: Loop Recorder Removal;  Surgeon: Deboraha Sprang, MD;  Location: [facility];  Service: Cardiovascular;  Laterality: N/A;  . PACEMAKER IMPLANT N/A 02/03/2017   Procedure: Pacemaker Implant;  Surgeon: Deboraha Sprang, MD;  Location: [facility];  Service: Cardiovascular;  Laterality: N/A;  . TUBAL LIGATION  1997  . WISDOM TOOTH EXTRACTION       OB History    Gravida  3   Para  2   Term  2   Preterm  0   AB  0   Living  2     SAB  0   TAB  0   Ectopic  0   Multiple  0   Live Births  2            Home Medications    Prior to Admission medications   Medication Sig Start Date End Date Taking? Authorizing Provider  benzonatate (TESSALON) 100 MG capsule Take 100 mg by mouth 2 (two) times daily  as needed for cough. 01/29/17   [provider]  doxycycline (VIBRA-TABS) 100 MG tablet Take 100 mg by mouth 2 (two) times daily. STARTED ON 01/29/17-02/04/17 01/29/17   [provider]  Probiotic Product (PROBIOTIC DAILY PO) Take 1 capsule by mouth daily.    [provider]  scopolamine (TRANSDERM-SCOP) 1 MG/3DAYS Place 1 patch (1.5 mg total) onto the skin every 3 (three) days. Patient taking differently: Place 1 patch onto the skin See admin instructions. EVERY 72 HOURS AS NEEDED FOR NAUSEA/VOMITING 06/26/16   Dettinger, Fransisca Kaufmann, MD    Family History Family History  Problem Relation Age of Onset  . Diabetes Maternal Grandfather   . Lung cancer Maternal Grandfather   . Diabetes Paternal Grandmother   . Diabetes Paternal Grandfather   . Kidney Stones Father   .  Gallbladder disease Mother   . Colon cancer Maternal Aunt 65       second occurence age 69  . Colon cancer Cousin 31       paternal cousin  . Breast cancer Paternal Aunt     Social History Social History   Tobacco Use  . Smoking status: Never Smoker  . Smokeless tobacco: Never Used  Substance Use Topics  . Alcohol use: Yes    Alcohol/week: 0.6 oz    Types: 1 Glasses of wine per week  . Drug use: No     Allergies   Penicillins and Zomig [zolmitriptan]   Review of Systems Review of Systems  Constitutional: Negative for chills and fever.  HENT: Negative.   Eyes: Negative.   Respiratory: Positive for shortness of breath.   Cardiovascular: Positive for chest pain. Negative for palpitations and leg swelling.  Gastrointestinal: Negative.   Genitourinary: Negative.   Musculoskeletal: Negative.   Skin: Negative.   Neurological: Positive for numbness.  Psychiatric/Behavioral: The patient is nervous/anxious.      Physical Exam Updated Vital Signs BP 112/73   Pulse 72   Temp 98.1 F (36.7 C) (Oral)   Resp 18   Ht 5\' 3"  (1.6 m)   Wt 92.5 kg (204 lb)   LMP 08/20/2011 (Exact Date)   SpO2 99%   BMI 36.14 kg/m   Physical Exam  Constitutional: She is oriented to person, place, and time. She appears well-developed and well-nourished. No distress.  Tearful  HENT:  Head: Normocephalic and atraumatic.  Eyes: Conjunctivae are normal. No scleral icterus.  Neck: Normal range of motion.  Cardiovascular: Normal rate, regular rhythm and normal heart sounds. Exam reveals no gallop and no friction rub.  No murmur heard. Pulmonary/Chest: Effort normal and breath sounds normal. No respiratory distress.  Abdominal: Soft. Bowel sounds are normal. She exhibits no distension and no mass. There is no tenderness. There is no guarding.  Musculoskeletal:       Right lower leg: She exhibits no edema.       Left lower leg: She exhibits no edema.  Neurological: She is alert and oriented to  person, place, and time.  Skin: Skin is warm and dry. She is not diaphoretic.  Psychiatric: Her behavior is normal. Her mood appears anxious.  Nursing note and vitals reviewed.    ED Treatments / Results  Labs (all labs ordered are listed, but only abnormal results are displayed) Labs Reviewed  CBC  BASIC METABOLIC PANEL  TROPONIN I    EKG None  Radiology No results found.  Procedures Procedures (including critical care time)  Medications Ordered in ED Medications  aspirin chewable tablet  324 mg (324 mg Oral Given 11/30/17 1454)     Initial Impression / Assessment and Plan / ED Course  I have reviewed the triage vital signs and the nursing notes.  Pertinent labs & imaging results that were available during my care of the patient were reviewed by me and considered in my medical decision making (see chart for details).  Clinical Course as of Nov 30 1644  Sun Nov 30, 2017  1506 Patient here with chest pain.  Her chest pain began at the onset of emotional distress.  She has a heart score of 1 making her low risk for acute coronary syndrome or major adverse cardiac event.  Differential diagnosis includes Takotsubo MI, MSK pain, Panic attack. I have low suspicion for PE. Patient will be given ASA.  CBC reviewed without abnormality. EKG   [AH]    Clinical Course User Index [AH] Margarita Mail, PA-C    47 year old female with biotech pacemaker which was interrogated.  She appears to have had 2 runs of SVT into the 170s correlating with the time of her pain.  This does not appear to be caused by the pacemaker or leads.  Given the fact that the patient has a history of deglutition syncope and bradycardia, feel that beta-blockers are optimal treatment for this patient.  I have discussed this case with the patient and feel she is most appropriately handled by her EP cardiologist Dr. Caryl Comes.  She has 2- troponins here with a low risk heart score.  She appears appropriate for discharge  at this time Final Clinical Impressions(s) / ED Diagnoses   Final diagnoses:  SVT (supraventricular tachycardia) [facility])    ED Discharge Orders    None       Margarita Mail, PA-C 12/01/17 0014    Davonna Belling, MD 12/01/17 0028

## 2017-12-01 ENCOUNTER — Encounter: Payer: Self-pay | Admitting: Internal Medicine

## 2017-12-03 ENCOUNTER — Ambulatory Visit: Payer: BLUE CROSS/BLUE SHIELD | Admitting: Internal Medicine

## 2017-12-03 ENCOUNTER — Encounter: Payer: Self-pay | Admitting: Internal Medicine

## 2017-12-03 VITALS — BP 114/73 | HR 71 | Ht 63.0 in | Wt 208.2 lb

## 2017-12-03 DIAGNOSIS — Z950 Presence of cardiac pacemaker: Secondary | ICD-10-CM | POA: Diagnosis not present

## 2017-12-03 DIAGNOSIS — I471 Supraventricular tachycardia: Secondary | ICD-10-CM

## 2017-12-03 DIAGNOSIS — G901 Familial dysautonomia [Riley-Day]: Secondary | ICD-10-CM | POA: Diagnosis not present

## 2017-12-03 DIAGNOSIS — R55 Syncope and collapse: Secondary | ICD-10-CM | POA: Diagnosis not present

## 2017-12-03 NOTE — Patient Instructions (Signed)
Medication Instructions: Your physician recommends that you continue on your current medications as directed. Please refer to the Current Medication list given to you today.  Labwork: None Ordered  Procedures/Testing: None Ordered  Follow-Up: Your physician recommends that you schedule a follow-up appointment in: Thursday 12/18/17 at 1:45 pm with Dr. Caryl Comes   If you need a refill on your cardiac medications before your next appointment, please call your pharmacy.

## 2017-12-03 NOTE — Progress Notes (Signed)
Patient Care Team: Dettinger, Fransisca Kaufmann, MD as PCP - General (Family Medicine)   HPI  Erika [name] is a 47 y.o. female Seen in followup after having been seen in the emergency room 5/19 for chest pain.  "She felt hysterical. Noted her hands and feet went numb " the aforementioned is a description from the emergency room.  In fact she says that what happened was she felt her heart suddenly race.  She got very anxious.  Not clear to me what was so distinctive about this event as she says these occur a couple times a week.  Originally, ILR was implanted 7/17   She underwent pacing with a Biotronik CLS device 9/18 for recurrent syncope.  No interval syncope  She has had some episodes of lightheadedness that is been aborted by sitting.  We reviewed the issues of volume depletion     She was seen i Past Medical History:  Diagnosis Date  . Abnormal uterine bleeding   . Complicated migraine 9/62/9528  . Heart murmur   . Migraine    "a few/year" (02/03/2017)  . Migraine with aura 02/12/2013  . Presence of permanent cardiac pacemaker   . Stress    saw neurologist 6/15 for numbess in face and arm    Past Surgical History:  Procedure Laterality Date  . CESAREAN SECTION  1994; 1997  . CYSTOSCOPY  09/10/2011   Procedure: CYSTOSCOPY;  Surgeon: Felipa Emory, MD;  Location: [facility];  Service: Gynecology;  Laterality: N/A;  . DILATION AND CURETTAGE OF UTERUS    . ENDOMETRIAL ABLATION     failed - done in MD office  . EP IMPLANTABLE DEVICE N/A 02/07/2016   Procedure: Loop Recorder Insertion;  Surgeon: Deboraha Sprang, MD;  Location: [facility];  Service: Cardiovascular;  Laterality: N/A;  . INSERT / REPLACE / REMOVE PACEMAKER  02/03/2017  . LAPAROSCOPIC TOTAL HYSTERECTOMY     robotic  . LOOP RECORDER REMOVAL  02/03/2017  . LOOP RECORDER REMOVAL N/A 02/03/2017   Procedure: Loop Recorder Removal;  Surgeon: Deboraha Sprang, MD;  Location: [facility];  Service:  Cardiovascular;  Laterality: N/A;  . PACEMAKER IMPLANT N/A 02/03/2017   Procedure: Pacemaker Implant;  Surgeon: Deboraha Sprang, MD;  Location: [facility];  Service: Cardiovascular;  Laterality: N/A;  . TUBAL LIGATION  1997  . WISDOM TOOTH EXTRACTION      Current Outpatient Medications  Medication Sig Dispense Refill  . Probiotic Product (PROBIOTIC DAILY PO) Take 1 capsule by mouth daily.     No current facility-administered medications for this visit.     Allergies  Allergen Reactions  . Penicillins Rash    Has patient had a PCN reaction causing immediate rash, facial/tongue/throat swelling, SOB or lightheadedness with hypotension: Yes Has patient had a PCN reaction causing severe rash involving mucus membranes or skin necrosis: No Has patient had a PCN reaction that required hospitalization No Has patient had a PCN reaction occurring within the last 10 years: Yes If all of the above answers are "NO", then may proceed with Cephalosporin use.   . Zomig [Zolmitriptan] Rash      Review of Systems negative except from HPI and PMH  Physical Exam BP 114/73   Pulse 71   Ht 5\' 3"  (1.6 m)   Wt 208 lb 3.2 oz (94.4 kg)   LMP 08/20/2011 (Exact Date)   SpO2 98%   BMI 36.88 kg/m  Well  developed and nourished in no acute distress HENT normal Neck supple with JVP-flat Clear Regular rate and rhythm, no murmurs or gallops Abd-soft with active BS No Clubbing cyanosis edema Skin-warm and dry A & Oriented  Grossly normal sensory and motor function   Atrial paced at 71 Interval 16/08/39 otherwise normal Otherwise normal  Assessment and  Plan  Syncope-Deglutition   Pacemaker -biotronik   SVT-long RP   Device interrogation identifies a SVT-long RP the mechanism which is not clear.  It could represent sinus, atrial tachycardia, or a long RP reentry tachycardia.  We will attempt to clarify the mechanism by reprogramming the onset of her rate detecting algorithm from  170--150.  If this is inadequate we will anticipate the use of a ZIO Patch.  No interval syncope.  We spent more than 50% of our >25 min visit in face to face counseling regarding the above

## 2017-12-05 LAB — CUP PACEART INCLINIC DEVICE CHECK
Date Time Interrogation Session: 20190517081605
Implantable Lead Implant Date: 20180716
Implantable Lead Implant Date: 20180716
Implantable Lead Location: 753859
Implantable Lead Location: 753860
Implantable Lead Model: 5076
Implantable Lead Model: 5076
Implantable Pulse Generator Implant Date: 20180716
Pulse Gen Model: 407145
Pulse Gen Serial Number: 69161362

## 2017-12-08 ENCOUNTER — Encounter: Payer: Self-pay | Admitting: Internal Medicine

## 2017-12-18 ENCOUNTER — Ambulatory Visit: Payer: BLUE CROSS/BLUE SHIELD | Admitting: Internal Medicine

## 2017-12-18 ENCOUNTER — Encounter: Payer: Self-pay | Admitting: Internal Medicine

## 2017-12-18 VITALS — BP 110/78 | HR 81 | Ht 63.0 in | Wt 209.2 lb

## 2017-12-18 DIAGNOSIS — Z950 Presence of cardiac pacemaker: Secondary | ICD-10-CM | POA: Diagnosis not present

## 2017-12-18 DIAGNOSIS — G901 Familial dysautonomia [Riley-Day]: Secondary | ICD-10-CM

## 2017-12-18 DIAGNOSIS — R55 Syncope and collapse: Secondary | ICD-10-CM | POA: Diagnosis not present

## 2017-12-18 DIAGNOSIS — I471 Supraventricular tachycardia: Secondary | ICD-10-CM | POA: Diagnosis not present

## 2017-12-18 LAB — CUP PACEART INCLINIC DEVICE CHECK
Brady Statistic RA Percent Paced: 40 %
Brady Statistic RV Percent Paced: 0 %
Implantable Lead Implant Date: 20180716
Implantable Lead Location: 753860
Implantable Lead Model: 5076
Implantable Lead Model: 5076
Lead Channel Impedance Value: 370 Ohm
Lead Channel Pacing Threshold Amplitude: 0.6 V
Lead Channel Pacing Threshold Pulse Width: 0.4 ms
Lead Channel Sensing Intrinsic Amplitude: 3.9 mV
Lead Channel Setting Pacing Amplitude: 2 V
Lead Channel Setting Pacing Amplitude: 2.4 V
Lead Channel Setting Pacing Pulse Width: 0.4 ms
MDC IDC LEAD IMPLANT DT: 20180716
MDC IDC LEAD LOCATION: 753859
MDC IDC MSMT LEADCHNL RA PACING THRESHOLD AMPLITUDE: 0.6 V
MDC IDC MSMT LEADCHNL RA PACING THRESHOLD PULSEWIDTH: 0.4 ms
MDC IDC MSMT LEADCHNL RV IMPEDANCE VALUE: 585 Ohm
MDC IDC MSMT LEADCHNL RV SENSING INTR AMPL: 17.2 mV
MDC IDC PG IMPLANT DT: 20180716
MDC IDC PG SERIAL: 69161362
MDC IDC SESS DTM: 20190530175341

## 2017-12-18 MED ORDER — BISOPROLOL FUMARATE 5 MG PO TABS: 2.5000 mg | ORAL_TABLET | Freq: Every day | ORAL | 0 refills | Status: DC

## 2017-12-18 MED ORDER — ATENOLOL 25 MG PO TABS: 25.0000 mg | ORAL_TABLET | Freq: Every day | ORAL | 0 refills | Status: DC

## 2017-12-18 MED ORDER — PROPRANOLOL HCL ER 60 MG PO CP24: 60.0000 mg | ORAL_CAPSULE | Freq: Every day | ORAL | 0 refills | Status: DC

## 2017-12-18 MED ORDER — METOPROLOL SUCCINATE ER 25 MG PO TB24: 25.0000 mg | ORAL_TABLET | Freq: Every day | ORAL | 0 refills | Status: DC

## 2017-12-18 NOTE — Patient Instructions (Signed)
Medication Instructions:  You are being given three prescriptions for different beta blockers to try.  You may take these in any order. DO NOT TAKE MORE THAN ONE BETA BLOCKER AT A TIME. Choose one beta blocker to start with. If after two weeks, you feel the medication is working well for you, continue that current medication. If it does not work well for you, try another prescription for a beta blocker at that time. You may continue that current beta blocker at that time if it works well for you. If it does not, repeat these instructions for the third beta blocker.  When you find a beta blocker that works well for you, call the office and we will send in a prescription for you.               1. Atenolol 25mg  tablet             2. Metoprolol Succ 25mg  tablet             3. Inderal LA 60mg  tablet  4. Bisoprolol 2.5mg  tablet  You may take these in any order you please.   Please call us with any questions.  Labwork: None ordered.  Testing/Procedures: None ordered.  Follow-Up: Your physician wants you to follow-up in: 8-10 weeks with Dr Caryl Comes. You will receive a reminder letter in the mail two months in advance. If you don't receive a letter, please call our office to schedule the follow-up appointment.  Remote monitoring is used to monitor your Pacemaker from home. This monitoring reduces the number of office visits required to check your device to one time per year. It allows [place] to keep an eye on the functioning of your device to ensure it is working properly. You are scheduled for a device check from home on 01/21/2018. You may send your transmission at any time that day. If you have a wireless device, the transmission will be sent automatically. After your physician reviews your transmission, you will receive a postcard with your next transmission date.    Any Other Special Instructions Will Be Listed Below (If Applicable).     If you need a refill on your cardiac medications  before your next appointment, please call your pharmacy.

## 2017-12-18 NOTE — Progress Notes (Signed)
Patient Care Team: Dettinger, Fransisca Kaufmann, MD as PCP - General (Family Medicine)   HPI  Erika [name] is a 47 y.o. female Seen in followup after having been seen in the emergency room 5/19 for chest pain.  "She felt hysterical. Noted her hands and feet went numb " the aforementioned is a description from the emergency room.  In fact she says that what happened was she felt her heart suddenly race.  She got very anxious.  Not clear to me what was so distinctive about this event as she says these occur a couple times a week.  Originally, ILR was implanted 7/17   She underwent pacing with a Biotronik CLS device 9/18 for recurrent syncope.  No interval syncope  Patient continues to have palpitations.  Again, most notably at work.  She is a new job and this is been quite stressful.  No chest pain. Past Medical History:  Diagnosis Date  . Abnormal uterine bleeding   . Complicated migraine 5/85/2778  . Heart murmur   . Migraine    "a few/year" (02/03/2017)  . Migraine with aura 02/12/2013  . Presence of permanent cardiac pacemaker   . Stress    saw neurologist 6/15 for numbess in face and arm    Past Surgical History:  Procedure Laterality Date  . CESAREAN SECTION  1994; 1997  . CYSTOSCOPY  09/10/2011   Procedure: CYSTOSCOPY;  Surgeon: Felipa Emory, MD;  Location: [facility];  Service: Gynecology;  Laterality: N/A;  . DILATION AND CURETTAGE OF UTERUS    . ENDOMETRIAL ABLATION     failed - done in MD office  . EP IMPLANTABLE DEVICE N/A 02/07/2016   Procedure: Loop Recorder Insertion;  Surgeon: Deboraha Sprang, MD;  Location: [facility];  Service: Cardiovascular;  Laterality: N/A;  . INSERT / REPLACE / REMOVE PACEMAKER  02/03/2017  . LAPAROSCOPIC TOTAL HYSTERECTOMY     robotic  . LOOP RECORDER REMOVAL  02/03/2017  . LOOP RECORDER REMOVAL N/A 02/03/2017   Procedure: Loop Recorder Removal;  Surgeon: Deboraha Sprang, MD;  Location: [facility];  Service:  Cardiovascular;  Laterality: N/A;  . PACEMAKER IMPLANT N/A 02/03/2017   Procedure: Pacemaker Implant;  Surgeon: Deboraha Sprang, MD;  Location: [facility];  Service: Cardiovascular;  Laterality: N/A;  . TUBAL LIGATION  1997  . WISDOM TOOTH EXTRACTION      Current Outpatient Medications  Medication Sig Dispense Refill  . Probiotic Product (PROBIOTIC DAILY PO) Take 1 capsule by mouth daily.     No current facility-administered medications for this visit.     Allergies  Allergen Reactions  . Penicillins Rash    Has patient had a PCN reaction causing immediate rash, facial/tongue/throat swelling, SOB or lightheadedness with hypotension: Yes Has patient had a PCN reaction causing severe rash involving mucus membranes or skin necrosis: No Has patient had a PCN reaction that required hospitalization No Has patient had a PCN reaction occurring within the last 10 years: Yes If all of the above answers are "NO", then may proceed with Cephalosporin use.   . Zomig [Zolmitriptan] Rash      Review of Systems negative except from HPI and PMH  Physical Exam BP 110/78   Pulse 81   Ht 5\' 3"  (1.6 m)   Wt 209 lb 3.2 oz (94.9 kg)   LMP 08/20/2011 (Exact Date)   SpO2 98%   BMI 37.06 kg/m  Well developed and  nourished in no acute distress HENT normal Neck supple with JVP-flat Clear Regular rate and rhythm, no murmurs or gallops Abd-soft with active BS No Clubbing cyanosis edema Skin-warm and dry A & Oriented  Grossly normal sensory and motor function    Assessment and  Plan  Syncope-Deglutition   Pacemaker -biotronik   SVT-long RP  No interval syncope  Continue with palpitations.  Not withstanding the reprogramming of her device, we are unable to identify the onset.  Electrogram morphology is similar to sinus.  I suspect that this is sinus although I do not know what the trigger is.  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  We will try beta-blockers at low dose to see if we can control her symptoms without having to undertake more testing.  She is concerned about syncope and her low blood pressure.  I think will be okay at low doses.  I have given a prescription for atenolol 25, metoprolol succinate 25, bisoprolol 2.5 and Inderal LA 60.  He will take them in random order.  We will plan to see her again in about  10 weeks time  We spent more than 50% of our >25 min visit in face to face counseling regarding the above

## 2018-01-12 ENCOUNTER — Encounter

## 2018-01-12 ENCOUNTER — Encounter: Payer: Self-pay | Admitting: Obstetrics & Gynecology

## 2018-01-12 ENCOUNTER — Other Ambulatory Visit: Payer: Self-pay

## 2018-01-12 ENCOUNTER — Ambulatory Visit: Payer: BLUE CROSS/BLUE SHIELD | Admitting: Obstetrics & Gynecology

## 2018-01-12 ENCOUNTER — Other Ambulatory Visit: Payer: Self-pay | Admitting: Internal Medicine

## 2018-01-12 VITALS — BP 118/60 | HR 92 | Resp 16 | Ht 62.5 in | Wt 204.8 lb

## 2018-01-12 DIAGNOSIS — Z01419 Encounter for gynecological examination (general) (routine) without abnormal findings: Secondary | ICD-10-CM | POA: Diagnosis not present

## 2018-01-12 DIAGNOSIS — D229 Melanocytic nevi, unspecified: Secondary | ICD-10-CM

## 2018-01-12 MED ORDER — PROPRANOLOL HCL ER 60 MG PO CP24
60.0000 mg | ORAL_CAPSULE | Freq: Every day | ORAL | 0 refills | Status: DC
Start: 2018-01-12 — End: 2018-01-12

## 2018-01-12 MED ORDER — PROPRANOLOL HCL ER 60 MG PO CP24: 60.0000 mg | ORAL_CAPSULE | Freq: Every day | ORAL | 3 refills | Status: DC

## 2018-01-12 NOTE — Telephone Encounter (Signed)
Pt's preference from BB trial

## 2018-01-12 NOTE — Progress Notes (Signed)
47 y.o. [id number] MarriedCaucasianF here for annual exam.  Went to the ER in early May.  Was in supraventricular tachycardia.  Seeing Dr. Caryl Comes.  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  Had Echo and stress test in 2017.  Now on a beta-blocker due to tachycardia.  Job was moved to [place].  Has a new job with the same company but there are now new responsibilities.  This was very stressful and she thinks this was part of the issues with her hear.    Denies vaginal bleeding.    Patient's last menstrual period was 08/20/2011 (exact date).          Sexually active: Yes.    The current method of family planning is status post hysterectomy.    Exercising: Yes.    walking Smoker:  no  Health Maintenance: Pap:  2012 Neg  History of abnormal Pap:  no MMG:  10/30/17 BIRADS1:Neg  Colonoscopy:  06/2012 Normal. F/u 10 years  BMD:   Never TDaP:  2015 Pneumonia vaccine(s):  n/a Shingrix:   n/a Hep C testing: n/a Screening Labs: Cardiology    reports that she has never smoked. She has never used smokeless tobacco. She reports that she drinks about 0.6 oz of alcohol per week. She reports that she does not use drugs.  Past Medical History:  Diagnosis Date  . Abnormal uterine bleeding   . Complicated migraine 03/19/5620  . Heart murmur   . Migraine    "a few/year" (02/03/2017)  . Migraine with aura 02/12/2013  . Presence of permanent cardiac pacemaker   . Stress    saw neurologist 6/15 for numbess in face and arm  . Ventricular tachycardia seen on cardiac monitor [facility])     Past Surgical History:  Procedure Laterality Date  . CESAREAN SECTION  1994; 1997  . CYSTOSCOPY  09/10/2011   Procedure: CYSTOSCOPY;  Surgeon: Felipa Emory, MD;  Location: [facility];  Service: Gynecology;  Laterality: N/A;  . DILATION AND CURETTAGE OF UTERUS    . ENDOMETRIAL ABLATION     failed - done in MD office  . EP IMPLANTABLE DEVICE N/A 02/07/2016   Procedure: Loop Recorder Insertion;  Surgeon: Deboraha Sprang, MD;   Location: [place] [facility];  Service: Cardiovascular;  Laterality: N/A;  . INSERT / REPLACE / REMOVE PACEMAKER  02/03/2017  . LAPAROSCOPIC TOTAL HYSTERECTOMY     robotic  . LOOP RECORDER REMOVAL  02/03/2017  . LOOP RECORDER REMOVAL N/A 02/03/2017   Procedure: Loop Recorder Removal;  Surgeon: Deboraha Sprang, MD;  Location: [place] [facility];  Service: Cardiovascular;  Laterality: N/A;  . PACEMAKER IMPLANT N/A 02/03/2017   Procedure: Pacemaker Implant;  Surgeon: Deboraha Sprang, MD;  Location: [place] [facility];  Service: Cardiovascular;  Laterality: N/A;  . TUBAL LIGATION  1997  . WISDOM TOOTH EXTRACTION      Current Outpatient Medications  Medication Sig Dispense Refill  . Calcium Carbonate-Vit D-Min (CALCIUM 1200 PO) Take by mouth daily.    . Probiotic Product (PROBIOTIC DAILY PO) Take 1 capsule by mouth daily.    . propranolol ER (INDERAL LA) 60 MG 24 hr capsule Take 1 capsule (60 mg total) by mouth daily. 90 capsule 3   No current facility-administered medications for this visit.     Family History  Problem Relation Age of Onset  . Diabetes Maternal Grandfather   . Lung cancer Maternal Grandfather   . Diabetes Paternal Grandmother   . Diabetes Paternal  Grandfather   . Kidney Stones Father   . Gallbladder disease Mother   . Colon cancer Maternal Aunt 67       second occurence age 29  . Colon cancer Cousin 34       paternal cousin  . Breast cancer Paternal Aunt     Review of Systems  All other systems reviewed and are negative.   Exam:   BP 118/60 (BP Location: Right Arm, Patient Position: Sitting, Cuff Size: Large)   Pulse 92   Resp 16   Ht 5' 2.5" (1.588 m)   Wt 204 lb 12.8 oz (92.9 kg)   LMP 08/20/2011 (Exact Date)   BMI 36.86 kg/m    Height: 5' 2.5" (158.8 cm)  Ht Readings from Last 3 Encounters:  01/12/18 5' 2.5" (1.588 m)  12/18/17 5\' 3"  (1.6 m)  12/03/17 5\' 3"  (1.6 m)    General appearance: alert, cooperative and appears stated age Head:  Normocephalic, without obvious abnormality, atraumatic Neck: no adenopathy, supple, symmetrical, trachea midline and thyroid normal to inspection and palpation Lungs: clear to auscultation bilaterally Breasts: normal appearance, no masses or tenderness Heart: regular rate and rhythm Abdomen: soft, non-tender; bowel sounds normal; no masses,  no organomegaly Extremities: extremities normal, atraumatic, no cyanosis or edema Skin: Skin color, texture, turgor normal. No rashes or lesions Lymph nodes: Cervical, supraclavicular, and axillary nodes normal. No abnormal inguinal nodes palpated Neurologic: Grossly normal   Pelvic: External genitalia:  no lesions              Urethra:  normal appearing urethra with no masses, tenderness or lesions              Bartholins and Skenes: normal                 Vagina: normal appearing vagina with normal color and discharge, no lesions              Cervix: absent              Pap taken: No. Bimanual Exam:  Uterus:  uterus absent              Adnexa: no mass, fullness, tenderness               Rectovaginal: Confirms               Anus:  normal sphincter tone, no lesions  Chaperone was present for exam.  A:  Well Woman with normal exam H/o migraines Supraventricular tachycardia followed by cardiology and on beta blocker H/O TLH/bilatearl salpingectomy 2/13 Family hx of colon cancer H/o atypical mole on vulvar in the past  P:   Mammogram guidelines reviewed. pap smear not indicated Lab work just recently done Referral to dermatology return annually or prn

## 2018-01-14 ENCOUNTER — Encounter: Payer: Self-pay | Admitting: Internal Medicine

## 2018-01-21 ENCOUNTER — Telehealth: Payer: Self-pay | Admitting: Cardiology

## 2018-01-21 ENCOUNTER — Ambulatory Visit (INDEPENDENT_AMBULATORY_CARE_PROVIDER_SITE_OTHER): Payer: BLUE CROSS/BLUE SHIELD | Admitting: *Deleted

## 2018-01-21 DIAGNOSIS — R55 Syncope and collapse: Secondary | ICD-10-CM

## 2018-01-21 NOTE — Progress Notes (Signed)
Remote pacemaker transmission.   

## 2018-01-21 NOTE — Telephone Encounter (Signed)
LMOVM reminding pt to send remote transmission.   

## 2018-02-04 ENCOUNTER — Telehealth: Payer: Self-pay | Admitting: Obstetrics & Gynecology

## 2018-02-04 NOTE — Telephone Encounter (Signed)
Call placed in reference to a referral for dermatology.

## 2018-02-17 ENCOUNTER — Telehealth: Payer: Self-pay | Admitting: Obstetrics & Gynecology

## 2018-02-17 NOTE — Telephone Encounter (Signed)
Dr. Ledell Peoples office has attempted to schedule the patient. They have called multiple times with no response. Per Dr. Ammie Ferrier note on 02/16/18 ok to close the referral.  Erika [name]

## 2018-02-20 LAB — CUP PACEART REMOTE DEVICE CHECK
Battery Remaining Percentage: 90 %
Brady Statistic AP VP Percent: 0 %
Brady Statistic AS VP Percent: 0 %
Brady Statistic AS VS Percent: 59 %
Brady Statistic RA Percent Paced: 41 %
Brady Statistic RV Percent Paced: 0 %
Date Time Interrogation Session: 20190802050227
Implantable Lead Implant Date: 20180716
Implantable Lead Location: 753859
Implantable Lead Location: 753860
Implantable Lead Model: 5076
Implantable Lead Model: 5076
Implantable Pulse Generator Implant Date: 20180716
Lead Channel Impedance Value: 358 Ohm
Lead Channel Pacing Threshold Amplitude: 0.7 V
Lead Channel Pacing Threshold Pulse Width: 0.4 ms
Lead Channel Setting Pacing Amplitude: 2.4 V
Lead Channel Setting Pacing Pulse Width: 0.4 ms
MDC IDC LEAD IMPLANT DT: 20180716
MDC IDC MSMT LEADCHNL RV IMPEDANCE VALUE: 554 Ohm
MDC IDC MSMT LEADCHNL RV PACING THRESHOLD AMPLITUDE: 0.6 V
MDC IDC MSMT LEADCHNL RV PACING THRESHOLD PULSEWIDTH: 0.4 ms
MDC IDC PG SERIAL: 69161362
MDC IDC SET LEADCHNL RA PACING AMPLITUDE: 2 V
MDC IDC STAT BRADY AP VS PERCENT: 41 %
Pulse Gen Model: 407145

## 2018-02-25 ENCOUNTER — Encounter: Payer: Self-pay | Admitting: Obstetrics & Gynecology

## 2018-03-05 ENCOUNTER — Encounter: Payer: Self-pay | Admitting: Internal Medicine

## 2018-03-05 ENCOUNTER — Ambulatory Visit: Payer: BLUE CROSS/BLUE SHIELD | Admitting: Internal Medicine

## 2018-03-05 VITALS — BP 102/65 | HR 77 | Ht 62.5 in | Wt 206.0 lb

## 2018-03-05 DIAGNOSIS — G901 Familial dysautonomia [Riley-Day]: Secondary | ICD-10-CM | POA: Diagnosis not present

## 2018-03-05 DIAGNOSIS — I471 Supraventricular tachycardia: Secondary | ICD-10-CM

## 2018-03-05 DIAGNOSIS — Z950 Presence of cardiac pacemaker: Secondary | ICD-10-CM | POA: Diagnosis not present

## 2018-03-05 DIAGNOSIS — R55 Syncope and collapse: Secondary | ICD-10-CM | POA: Diagnosis not present

## 2018-03-05 NOTE — Patient Instructions (Signed)
Medication Instructions:  Your physician recommends that you continue on your current medications as directed. Please refer to the Current Medication list given to you today.  Labwork: None ordered.  Testing/Procedures: None ordered.  Follow-Up: Your physician wants you to follow-up in: 6 months with Tommye Standard, PA. You will receive a reminder letter in the mail two months in advance. If you don't receive a letter, please call our office to schedule the follow-up appointment.  Remote monitoring is used to monitor your Pacemaker of ICD from home. This monitoring reduces the number of office visits required to check your device to one time per year. It allows [place] to keep an eye on the functioning of your device to ensure it is working properly. You are scheduled for a device check from home on 10/2. You may send your transmission at any time that day. If you have a wireless device, the transmission will be sent automatically. After your physician reviews your transmission, you will receive a postcard with your next transmission date.    Any Other Special Instructions Will Be Listed Below (If Applicable).     If you need a refill on your cardiac medications before your next appointment, please call your pharmacy.

## 2018-03-05 NOTE — Progress Notes (Signed)
Patient Care Team: Dettinger, Fransisca Kaufmann, MD as PCP - General (Family Medicine)   HPI  Erika [name] is a 47 y.o. 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  More recently she was having problems with sinus tachycardia mostly at work.  She was given beta-blockers   She underwent pacing with a Biotronik CLS device 9/18 for recurrent syncope.  No interval syncope  She is much improved following the addition of propranolol with fewer palpitations improved exercise tolerance.  She is going down to [facility] every weekend    She is a new job and this is been quite stressful.  No chest pain. Past Medical History:  Diagnosis Date  . Abnormal uterine bleeding   . Complicated migraine 7/41/2878  . Heart murmur   . Migraine    "a few/year" (02/03/2017)  . Migraine with aura 02/12/2013  . Presence of permanent cardiac pacemaker   . Stress    saw neurologist 6/15 for numbess in face and arm  . Ventricular tachycardia seen on cardiac monitor [facility])     Past Surgical History:  Procedure Laterality Date  . CESAREAN SECTION  1994; 1997  . CYSTOSCOPY  09/10/2011   Procedure: CYSTOSCOPY;  Surgeon: Felipa Emory, MD;  Location: [facility];  Service: Gynecology;  Laterality: N/A;  . DILATION AND CURETTAGE OF UTERUS    . ENDOMETRIAL ABLATION     failed - done in MD office  . EP IMPLANTABLE DEVICE N/A 02/07/2016   Procedure: Loop Recorder Insertion;  Surgeon: Deboraha Sprang, MD;  Location: [place] [facility];  Service: Cardiovascular;  Laterality: N/A;  . INSERT / REPLACE / REMOVE PACEMAKER  02/03/2017  . LAPAROSCOPIC TOTAL HYSTERECTOMY     robotic  . LOOP RECORDER REMOVAL  02/03/2017  . LOOP RECORDER REMOVAL N/A 02/03/2017   Procedure: Loop Recorder Removal;  Surgeon: Deboraha Sprang, MD;  Location: [place] [facility];  Service: Cardiovascular;  Laterality: N/A;  . PACEMAKER IMPLANT N/A 02/03/2017   Procedure: Pacemaker Implant;  Surgeon: Deboraha Sprang, MD;  Location:  [place] [facility];  Service: Cardiovascular;  Laterality: N/A;  . TUBAL LIGATION  1997  . WISDOM TOOTH EXTRACTION      Current Outpatient Medications  Medication Sig Dispense Refill  . Calcium Carbonate-Vit D-Min (CALCIUM 1200 PO) Take by mouth daily.    . Probiotic Product (PROBIOTIC DAILY PO) Take 1 capsule by mouth daily.    . propranolol ER (INDERAL LA) 60 MG 24 hr capsule Take 1 capsule (60 mg total) by mouth daily. 90 capsule 3   No current facility-administered medications for this visit.     Allergies  Allergen Reactions  . Penicillins Rash    Has patient had a PCN reaction causing immediate rash, facial/tongue/throat swelling, SOB or lightheadedness with hypotension: Yes Has patient had a PCN reaction causing severe rash involving mucus membranes or skin necrosis: No Has patient had a PCN reaction that required hospitalization No Has patient had a PCN reaction occurring within the last 10 years: Yes If all of the above answers are "NO", then may proceed with Cephalosporin use.   . Zomig [Zolmitriptan] Rash      Review of Systems negative except from HPI and PMH  Physical Exam BP 102/65   Pulse 77   Ht 5' 2.5" (1.588 m)   Wt 206 lb (93.4 kg)   LMP 08/20/2011 (Exact Date)   SpO2 97%   BMI 37.08 kg/m  Well  developed and nourished in no acute distress HENT normal Neck supple with JVP-flat Clear Regular rate and rhythm, no murmurs or gallops Abd-soft with active BS No Clubbing cyanosis edema Skin-warm and dry A & Oriented  Grossly normal sensory and motor function   Sinsu @ 77 15/08/37  Assessment and  Plan  Syncope-Deglutition   Pacemaker -biotronik   SVT-long RP  No interval syncope  Much better with less tachycardia on propranolol   Continue with palpitations.  Not withstanding the reprogramming of her device, we are unable to identify the onset.  Electrogram morphology is similar to sinus.  I suspect that this is sinus although I do not know  what the trigger is.  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  We will try beta-blockers at low dose to see if we can control her symptoms without having to undertake more testing.  She is concerned about syncope and her low blood pressure.  I think will be okay at low doses.  I have given a prescription for atenolol 25, metoprolol succinate 25, bisoprolol 2.5 and Inderal LA 60.  He will take them in random order.  We will plan to see her again in about  10 weeks time  We spent more than 50% of our >25 min visit in face to face counseling regarding the above

## 2018-04-22 ENCOUNTER — Ambulatory Visit (INDEPENDENT_AMBULATORY_CARE_PROVIDER_SITE_OTHER): Payer: BLUE CROSS/BLUE SHIELD | Admitting: *Deleted

## 2018-04-22 DIAGNOSIS — R55 Syncope and collapse: Secondary | ICD-10-CM | POA: Diagnosis not present

## 2018-04-22 NOTE — Progress Notes (Signed)
Remote pacemaker transmission.   

## 2018-05-12 DIAGNOSIS — D2239 Melanocytic nevi of other parts of face: Secondary | ICD-10-CM | POA: Diagnosis not present

## 2018-05-12 DIAGNOSIS — L820 Inflamed seborrheic keratosis: Secondary | ICD-10-CM | POA: Diagnosis not present

## 2018-05-12 DIAGNOSIS — D2339 Other benign neoplasm of skin of other parts of face: Secondary | ICD-10-CM | POA: Diagnosis not present

## 2018-05-12 DIAGNOSIS — L814 Other melanin hyperpigmentation: Secondary | ICD-10-CM | POA: Diagnosis not present

## 2018-05-12 DIAGNOSIS — L570 Actinic keratosis: Secondary | ICD-10-CM | POA: Diagnosis not present

## 2018-05-12 DIAGNOSIS — L249 Irritant contact dermatitis, unspecified cause: Secondary | ICD-10-CM | POA: Diagnosis not present

## 2018-05-12 DIAGNOSIS — L821 Other seborrheic keratosis: Secondary | ICD-10-CM | POA: Diagnosis not present

## 2018-05-12 DIAGNOSIS — D225 Melanocytic nevi of trunk: Secondary | ICD-10-CM | POA: Diagnosis not present

## 2018-06-01 LAB — CUP PACEART REMOTE DEVICE CHECK
Brady Statistic AP VS Percent: 22 %
Brady Statistic AS VP Percent: 0 %
Brady Statistic RA Percent Paced: 22 %
Brady Statistic RV Percent Paced: 0 %
Implantable Lead Implant Date: 20180716
Implantable Lead Location: 753859
Implantable Lead Location: 753860
Implantable Lead Model: 5076
Implantable Lead Model: 5076
Lead Channel Impedance Value: 356 Ohm
Lead Channel Pacing Threshold Amplitude: 0.7 V
Lead Channel Pacing Threshold Pulse Width: 0.4 ms
Lead Channel Setting Pacing Amplitude: 2 V
Lead Channel Setting Pacing Amplitude: 2.4 V
Lead Channel Setting Pacing Pulse Width: 0.4 ms
MDC IDC LEAD IMPLANT DT: 20180716
MDC IDC MSMT BATTERY REMAINING PERCENTAGE: 85 %
MDC IDC MSMT LEADCHNL RA PACING THRESHOLD PULSEWIDTH: 0.4 ms
MDC IDC MSMT LEADCHNL RV IMPEDANCE VALUE: 545 Ohm
MDC IDC MSMT LEADCHNL RV PACING THRESHOLD AMPLITUDE: 0.6 V
MDC IDC PG IMPLANT DT: 20180716
MDC IDC PG SERIAL: 69161362
MDC IDC SESS DTM: 20191108045406
MDC IDC STAT BRADY AP VP PERCENT: 0 %
MDC IDC STAT BRADY AS VS PERCENT: 78 %

## 2018-07-23 ENCOUNTER — Ambulatory Visit (INDEPENDENT_AMBULATORY_CARE_PROVIDER_SITE_OTHER): Payer: BLUE CROSS/BLUE SHIELD

## 2018-07-23 DIAGNOSIS — R55 Syncope and collapse: Secondary | ICD-10-CM | POA: Diagnosis not present

## 2018-07-23 LAB — CUP PACEART REMOTE DEVICE CHECK
Date Time Interrogation Session: 20200102194540
Implantable Lead Implant Date: 20180716
Implantable Lead Location: 753859
Implantable Lead Model: 5076
MDC IDC LEAD IMPLANT DT: 20180716
MDC IDC LEAD LOCATION: 753860
MDC IDC PG IMPLANT DT: 20180716
MDC IDC PG SERIAL: 69161362
Pulse Gen Model: 407145

## 2018-07-23 NOTE — Progress Notes (Signed)
Remote pacemaker transmission.   

## 2018-08-20 ENCOUNTER — Telehealth: Payer: Self-pay | Admitting: Obstetrics & Gynecology

## 2018-08-20 ENCOUNTER — Encounter: Payer: Self-pay | Admitting: Obstetrics & Gynecology

## 2018-08-20 NOTE — Telephone Encounter (Signed)
Patient sent the following correspondence through [place]. Routing to triage to assist patient with request.  I have a small area on my left breast that has been there a couple weeks. It itches. Almost looks like poison oak. Should I make an appointment to have it checked out? With my recent history of multiple mammograms it concerns me.

## 2018-08-20 NOTE — Telephone Encounter (Signed)
Patient returning call to triage nurse

## 2018-08-20 NOTE — Telephone Encounter (Signed)
Returned call to patient. Patient states she has a quarter-sized itchy, rash "right outside" of her nipple on her left breast. Patient states it has been there a couple of weeks. Denies fever. Last MMG was normal on 10-30-17. OV recommended for further evaluation. Patient scheduled for 08-21-2018 at 1515 with Dr. Sabra Heck. Patient agreeable to date and time of appointment.   Routing to provider and will close encounter.

## 2018-08-20 NOTE — Telephone Encounter (Signed)
Message left to return call to Triage Nurse at [phone].    

## 2018-08-21 ENCOUNTER — Encounter: Payer: Self-pay | Admitting: Obstetrics & Gynecology

## 2018-08-21 ENCOUNTER — Ambulatory Visit: Payer: BLUE CROSS/BLUE SHIELD | Admitting: Obstetrics & Gynecology

## 2018-08-21 VITALS — BP 118/72 | HR 88 | Resp 16 | Ht 62.5 in | Wt 221.4 lb

## 2018-08-21 DIAGNOSIS — R234 Changes in skin texture: Secondary | ICD-10-CM

## 2018-08-21 MED ORDER — TRIAMCINOLONE ACETONIDE 0.5 % EX CREA: 1.0000 "application " | TOPICAL_CREAM | Freq: Three times a day (TID) | CUTANEOUS | 0 refills | Status: AC

## 2018-08-21 NOTE — Progress Notes (Signed)
GYNECOLOGY  VISIT  CC:  Left breast rash   HPI: 48 y.o. [id number] Married White or Caucasian female here for Left breast itching and rash that has been present for several days.  It does not hurt.  She has not had any trauma to the breast.  She did read online about inflammatory breast cancer and wanted to have breast evaluated.  She did have abnormal screening MMG on the right in 2018 and had 6 month follow up for a year.  She does have some anxiety about this change on the breast being something significant so desired evaluation.   GYNECOLOGIC HISTORY: Patient's last menstrual period was 08/20/2011 (exact date). Contraception: Hysterectomy  Menopausal hormone therapy: none  Patient Active Problem List   Diagnosis Date Noted  . 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 Syncope 02/07/2016  . Migraine with aura 02/12/2013  . Complicated migraine 02/40/9735    Past Medical History:  Diagnosis Date  . Abnormal uterine bleeding   . Complicated migraine 10/17/9240  . Heart murmur   . Migraine    "a few/year" (02/03/2017)  . Migraine with aura 02/12/2013  . Presence of permanent cardiac pacemaker   . Stress    saw neurologist 6/15 for numbess in face and arm  . Ventricular tachycardia seen on cardiac monitor [facility])     Past Surgical History:  Procedure Laterality Date  . CESAREAN SECTION  1994; 1997  . CYSTOSCOPY  09/10/2011   Procedure: CYSTOSCOPY;  Surgeon: Felipa Emory, MD;  Location: [facility];  Service: Gynecology;  Laterality: N/A;  . DILATION AND CURETTAGE OF UTERUS    . ENDOMETRIAL ABLATION     failed - done in MD office  . EP IMPLANTABLE DEVICE N/A 02/07/2016   Procedure: Loop Recorder Insertion;  Surgeon: Deboraha Sprang, MD;  Location: [place] [facility];  Service: Cardiovascular;  Laterality: N/A;  . INSERT / REPLACE / REMOVE PACEMAKER  02/03/2017  . LAPAROSCOPIC TOTAL HYSTERECTOMY     robotic  . LOOP RECORDER REMOVAL  02/03/2017  . LOOP RECORDER REMOVAL N/A 02/03/2017   Procedure: Loop Recorder Removal;  Surgeon: Deboraha Sprang, MD;  Location: [place] [facility];  Service: Cardiovascular;  Laterality: N/A;  . PACEMAKER IMPLANT N/A 02/03/2017   Procedure: Pacemaker Implant;  Surgeon: Deboraha Sprang, MD;  Location: [place] [facility];  Service: Cardiovascular;  Laterality: N/A;  . TUBAL LIGATION  1997  . WISDOM TOOTH EXTRACTION      MEDS:   Current Outpatient Medications on File Prior to Visit  Medication Sig Dispense Refill  . Calcium Carbonate-Vit D-Min (CALCIUM 1200 PO) Take by mouth daily.    . Probiotic Product (PROBIOTIC DAILY PO) Take 1 capsule by mouth daily.    . propranolol ER (INDERAL LA) 60 MG 24 hr capsule Take 1 capsule (60 mg total) by mouth daily. 90 capsule 3   No current facility-administered medications on file prior to visit.     ALLERGIES: Penicillins and Zomig [zolmitriptan]  Family History  Problem Relation Age of Onset  . Diabetes Maternal Grandfather   . Lung cancer Maternal Grandfather   . Diabetes Paternal Grandmother   . Diabetes Paternal Grandfather   . Kidney Stones Father   . Gallbladder disease Mother   . Colon cancer Maternal Aunt 69       second occurence age 42  . Colon cancer Cousin 60       paternal cousin  . Breast cancer Paternal Aunt     SH:  Married, non smoker  Review of Systems  Skin: Positive for rash.       Itching   All other systems reviewed and are negative.   PHYSICAL EXAMINATION:    BP 118/72 (BP Location: Right Arm, Patient Position: Sitting, Cuff Size: Large)   Pulse 88   Resp 16   Ht 5' 2.5" (1.588 m)   Wt 221 lb 6.4 oz (100.4 kg)   LMP 08/20/2011 (Exact Date)   BMI 39.85 kg/m     Physical Exam  Constitutional: She appears well-developed and well-nourished.  Neck: No thyromegaly present.  Respiratory: Right breast exhibits skin change. Right breast exhibits no inverted nipple, no mass, no nipple discharge and no tenderness. Left breast exhibits no inverted nipple, no mass, no  nipple discharge, no skin change and no tenderness. Breasts are symmetrical.    Lymphadenopathy:    She has no cervical adenopathy.    She has no axillary adenopathy.    Assessment: Breast skin erythema c/w rash  Plan: Will use triamcinolone cream 0.5% TID and give update in 5-7 days.  If not significantly better, will proceed with diagnostic imaging of the breast.

## 2018-09-04 DIAGNOSIS — G5602 Carpal tunnel syndrome, left upper limb: Secondary | ICD-10-CM | POA: Diagnosis not present

## 2018-09-04 DIAGNOSIS — M79642 Pain in left hand: Secondary | ICD-10-CM | POA: Diagnosis not present

## 2018-09-04 DIAGNOSIS — G5601 Carpal tunnel syndrome, right upper limb: Secondary | ICD-10-CM | POA: Diagnosis not present

## 2018-09-21 DIAGNOSIS — G5603 Carpal tunnel syndrome, bilateral upper limbs: Secondary | ICD-10-CM | POA: Diagnosis not present

## 2018-10-06 DIAGNOSIS — G5601 Carpal tunnel syndrome, right upper limb: Secondary | ICD-10-CM | POA: Diagnosis not present

## 2018-10-06 DIAGNOSIS — G5602 Carpal tunnel syndrome, left upper limb: Secondary | ICD-10-CM | POA: Diagnosis not present

## 2018-10-06 DIAGNOSIS — M65832 Other synovitis and tenosynovitis, left forearm: Secondary | ICD-10-CM | POA: Diagnosis not present

## 2018-10-21 LAB — CUP PACEART REMOTE DEVICE CHECK
Date Time Interrogation Session: 20200402110709
Implantable Lead Implant Date: 20180716
Implantable Lead Implant Date: 20180716
Implantable Lead Location: 753859
Implantable Lead Location: 753860
Implantable Lead Model: 5076
Implantable Lead Model: 5076
Implantable Pulse Generator Implant Date: 20180716
Pulse Gen Model: 407145
Pulse Gen Serial Number: 69161362

## 2018-10-22 ENCOUNTER — Ambulatory Visit (INDEPENDENT_AMBULATORY_CARE_PROVIDER_SITE_OTHER): Payer: BLUE CROSS/BLUE SHIELD | Admitting: *Deleted

## 2018-10-22 ENCOUNTER — Other Ambulatory Visit: Payer: Self-pay

## 2018-10-22 DIAGNOSIS — R55 Syncope and collapse: Secondary | ICD-10-CM

## 2018-10-22 DIAGNOSIS — I471 Supraventricular tachycardia: Secondary | ICD-10-CM

## 2018-10-30 ENCOUNTER — Encounter: Payer: Self-pay | Admitting: Cardiology

## 2018-10-30 NOTE — Progress Notes (Signed)
Remote pacemaker transmission.   

## 2018-11-05 DIAGNOSIS — M65832 Other synovitis and tenosynovitis, left forearm: Secondary | ICD-10-CM | POA: Diagnosis not present

## 2018-11-05 DIAGNOSIS — G5601 Carpal tunnel syndrome, right upper limb: Secondary | ICD-10-CM | POA: Diagnosis not present

## 2018-11-05 DIAGNOSIS — G5602 Carpal tunnel syndrome, left upper limb: Secondary | ICD-10-CM | POA: Diagnosis not present

## 2018-11-30 ENCOUNTER — Other Ambulatory Visit: Payer: Self-pay | Admitting: Obstetrics & Gynecology

## 2018-11-30 DIAGNOSIS — Z1231 Encounter for screening mammogram for malignant neoplasm of breast: Secondary | ICD-10-CM

## 2018-12-02 ENCOUNTER — Telehealth (INDEPENDENT_AMBULATORY_CARE_PROVIDER_SITE_OTHER): Payer: BLUE CROSS/BLUE SHIELD | Admitting: Internal Medicine

## 2018-12-02 ENCOUNTER — Other Ambulatory Visit: Payer: Self-pay

## 2018-12-02 DIAGNOSIS — Z950 Presence of cardiac pacemaker: Secondary | ICD-10-CM

## 2018-12-02 DIAGNOSIS — I471 Supraventricular tachycardia, unspecified: Secondary | ICD-10-CM

## 2018-12-02 DIAGNOSIS — G901 Familial dysautonomia [Riley-Day]: Secondary | ICD-10-CM

## 2018-12-02 DIAGNOSIS — R55 Syncope and collapse: Secondary | ICD-10-CM

## 2018-12-02 MED ORDER — METOPROLOL SUCCINATE ER 25 MG PO TB24: 25.0000 mg | ORAL_TABLET | Freq: Every day | ORAL | 11 refills | Status: DC

## 2018-12-02 NOTE — Progress Notes (Signed)
Electrophysiology TeleHealth Note   Due to national recommendations of social distancing due to COVID 19, an audio/video telehealth visit is felt to be most appropriate for this patient at this time.  See MyChart message from today for the patient's consent to telehealth for Erika [name].   Date:  12/02/2018   ID:  Erika [name], Erika [name] Aug 15, 1970, MRN [id number]  Location: patient's home  Provider location: [address], [place]  Evaluation Performed: Follow-up visit  PCP:  Dettinger, Fransisca Kaufmann, MD  Cardiologist:     Electrophysiologist:  SK   Chief Complaint:  Syncope and palpitations  History of Present Illness:    Erika [name] is a 48 y.o. female who presents via audio/video conferencing for a telehealth visit today.  Since last being seen in our clinic for recurrent syncope, deglutition with documented bradycardia and s/p Biotronik CLS pacing, the patient reports no interval syncope  The patient denies chest pain, shortness of breath, nocturnal dyspnea, orthopnea or peripheral edema.  There have been no palpitations, lightheadedness or syncope.   Also hx of long RP ? Sinus tach for which we had discussed BB at last visit  Tried inderal with much improvement but fatigue  The patient denies symptoms of fevers, chills, cough, or new SOB worrisome for COVID 19.    Past Medical History:  Diagnosis Date  . Abnormal uterine bleeding   . Complicated migraine 6/60/6301  . Heart murmur   . Migraine    "a few/year" (02/03/2017)  . Migraine with aura 02/12/2013  . Presence of permanent cardiac pacemaker   . Stress    saw neurologist 6/15 for numbess in face and arm  . Ventricular tachycardia seen on cardiac monitor [facility])     Past Surgical History:  Procedure Laterality Date  . CESAREAN SECTION  1994; 1997  . CYSTOSCOPY  09/10/2011   Procedure: CYSTOSCOPY;  Surgeon: Felipa Emory, MD;  Location: [facility];  Service: Gynecology;  Laterality: N/A;  . DILATION AND  CURETTAGE OF UTERUS    . ENDOMETRIAL ABLATION     failed - done in MD office  . EP IMPLANTABLE DEVICE N/A 02/07/2016   Procedure: Loop Recorder Insertion;  Surgeon: Deboraha Sprang, MD;  Location: [facility];  Service: Cardiovascular;  Laterality: N/A;  . INSERT / REPLACE / REMOVE PACEMAKER  02/03/2017  . LAPAROSCOPIC TOTAL HYSTERECTOMY     robotic  . LOOP RECORDER REMOVAL  02/03/2017  . LOOP RECORDER REMOVAL N/A 02/03/2017   Procedure: Loop Recorder Removal;  Surgeon: Deboraha Sprang, MD;  Location: [place] [facility];  Service: Cardiovascular;  Laterality: N/A;  . PACEMAKER IMPLANT N/A 02/03/2017   Procedure: Pacemaker Implant;  Surgeon: Deboraha Sprang, MD;  Location: [place] [facility];  Service: Cardiovascular;  Laterality: N/A;  . TUBAL LIGATION  1997  . WISDOM TOOTH EXTRACTION      Current Outpatient Medications  Medication Sig Dispense Refill  . Calcium Carbonate-Vit D-Min (CALCIUM 1200 PO) Take by mouth daily.    . Probiotic Product (PROBIOTIC DAILY PO) Take 1 capsule by mouth daily.    . propranolol ER (INDERAL LA) 60 MG 24 hr capsule Take 1 capsule (60 mg total) by mouth daily. 90 capsule 3  . triamcinolone cream (KENALOG) 0.5 % Apply 1 application topically 3 (three) times daily. 30 g 0   No current facility-administered medications for this visit.     Allergies:   Penicillins and Zomig [zolmitriptan]  Social History:  The patient  reports that she has never smoked. She has never used smokeless tobacco. She reports current alcohol use of about 1.0 standard drinks of alcohol per week. She reports that she does not use drugs.   Family History:  The patient's   family history includes Breast cancer in her paternal aunt; Colon cancer (age of onset: 72) in her cousin; Colon cancer (age of onset: 29) in her maternal aunt; Diabetes in her maternal grandfather, paternal grandfather, and paternal grandmother; Gallbladder disease in her mother; Kidney Stones in her father; Lung  cancer in her maternal grandfather.   ROS:  Please see the history of present illness.   All other systems are personally reviewed and negative.    Exam:    Vital Signs:  LMP 08/20/2011 (Exact Date)     Well appearing, alert and conversant, regular work of breathing,  good skin color Eyes- anicteric, neuro- grossly intact, skin- no apparent rash or lesions or cyanosis, mouth- oral mucosa is pink   Labs/Other Tests and Data Reviewed:    Recent Labs: No results found for requested labs within last 8760 hours.   Wt Readings from Last 3 Encounters:  08/21/18 221 lb 6.4 oz (100.4 kg)  03/05/18 206 lb (93.4 kg)  01/12/18 204 lb 12.8 oz (92.9 kg)     Other studies personally reviewed: Additional studies/ records that were reviewed today include:   Last device remote is reviewed from [place] PDF dated 4/20 which reveals normal device function,   arrhythmias - none     ASSESSMENT & PLAN:    Syncope-Deglutition   Pacemaker -biotronik   SVT-long RP ? sinus   Tachycardia much improved on indreal, but fatigue, so will try an alternative-- metoprolol succinate 25  No syncope     COVID 19 screen The patient denies symptoms of COVID 19 at this time.  The importance of social distancing was discussed today.  Follow-up:  *14m Next remote: .As Scheduled   Current medicines are reviewed at length with the patient today.   The patient has concerns regarding her medicines.  The following changes were made today:   As above Change inderal to metoprolol succinate 25 mg daily  Labs/ tests ordered today include:   No orders of the defined types were placed in this encounter.   Future tests ( post COVID )     Patient Risk:  after full review of this patients clinical status, I feel that they are at moderate risk at this time.  Today, I have spen11 minutes with the patient with telehealth technology discussing the above.  Signed, Virl Axe, MD  12/02/2018 9:43 AM      [place] [address] [phone] (office) [phone] (fax)

## 2019-01-21 ENCOUNTER — Ambulatory Visit: Payer: BLUE CROSS/BLUE SHIELD

## 2019-01-28 ENCOUNTER — Other Ambulatory Visit: Payer: Self-pay

## 2019-01-28 ENCOUNTER — Ambulatory Visit (INDEPENDENT_AMBULATORY_CARE_PROVIDER_SITE_OTHER): Payer: BC Managed Care – PPO | Admitting: *Deleted

## 2019-01-28 ENCOUNTER — Ambulatory Visit
Admission: RE | Admit: 2019-01-28 | Discharge: 2019-01-28 | Disposition: A | Discharge status: Home / Self Care | Payer: BC Managed Care – PPO | Source: Ambulatory Visit | Attending: Obstetrics & Gynecology | Admitting: Obstetrics & Gynecology

## 2019-01-28 DIAGNOSIS — R55 Syncope and collapse: Secondary | ICD-10-CM | POA: Diagnosis not present

## 2019-01-28 DIAGNOSIS — Z1231 Encounter for screening mammogram for malignant neoplasm of breast: Secondary | ICD-10-CM | POA: Diagnosis not present

## 2019-01-28 LAB — CUP PACEART REMOTE DEVICE CHECK
Battery Remaining Percentage: 80 %
Brady Statistic RA Percent Paced: 41 %
Brady Statistic RV Percent Paced: 0 %
Date Time Interrogation Session: 20200709162224
Implantable Lead Implant Date: 20180716
Implantable Lead Implant Date: 20180716
Implantable Lead Location: 753859
Implantable Lead Location: 753860
Implantable Lead Model: 5076
Implantable Lead Model: 5076
Implantable Pulse Generator Implant Date: 20180716
Lead Channel Impedance Value: 351 Ohm
Lead Channel Impedance Value: 527 Ohm
Lead Channel Pacing Threshold Amplitude: 0.5 V
Lead Channel Pacing Threshold Amplitude: 0.6 V
Lead Channel Pacing Threshold Pulse Width: 0.4 ms
Lead Channel Pacing Threshold Pulse Width: 0.4 ms
Lead Channel Sensing Intrinsic Amplitude: 15.4 mV
Lead Channel Sensing Intrinsic Amplitude: 3.7 mV
Lead Channel Setting Pacing Amplitude: 2 V
Lead Channel Setting Pacing Amplitude: 2.4 V
Lead Channel Setting Pacing Pulse Width: 0.4 ms
Pulse Gen Model: 407145
Pulse Gen Serial Number: 69161362

## 2019-01-29 ENCOUNTER — Other Ambulatory Visit: Payer: Self-pay | Admitting: Obstetrics & Gynecology

## 2019-01-29 DIAGNOSIS — R928 Other abnormal and inconclusive findings on diagnostic imaging of breast: Secondary | ICD-10-CM

## 2019-02-03 ENCOUNTER — Ambulatory Visit
Admission: RE | Admit: 2019-02-03 | Discharge: 2019-02-03 | Disposition: A | Discharge status: Home / Self Care | Payer: BC Managed Care – PPO | Source: Ambulatory Visit | Attending: Obstetrics & Gynecology | Admitting: Obstetrics & Gynecology

## 2019-02-03 ENCOUNTER — Other Ambulatory Visit: Payer: Self-pay | Admitting: Obstetrics & Gynecology

## 2019-02-03 DIAGNOSIS — N6002 Solitary cyst of left breast: Secondary | ICD-10-CM | POA: Diagnosis not present

## 2019-02-03 DIAGNOSIS — N6320 Unspecified lump in the left breast, unspecified quadrant: Secondary | ICD-10-CM

## 2019-02-03 DIAGNOSIS — R928 Other abnormal and inconclusive findings on diagnostic imaging of breast: Secondary | ICD-10-CM | POA: Diagnosis not present

## 2019-02-04 NOTE — Progress Notes (Signed)
Remote pacemaker transmission.   

## 2019-04-26 ENCOUNTER — Other Ambulatory Visit: Payer: Self-pay

## 2019-04-28 ENCOUNTER — Ambulatory Visit: Payer: BC Managed Care – PPO | Admitting: Obstetrics & Gynecology

## 2019-04-29 ENCOUNTER — Ambulatory Visit: Payer: BC Managed Care – PPO | Admitting: Obstetrics & Gynecology

## 2019-04-29 ENCOUNTER — Other Ambulatory Visit: Payer: Self-pay

## 2019-04-29 ENCOUNTER — Encounter: Payer: Self-pay | Admitting: Obstetrics & Gynecology

## 2019-04-29 VITALS — BP 102/80 | HR 84 | Temp 97.7°F | Ht 62.5 in | Wt 221.0 lb

## 2019-04-29 DIAGNOSIS — Z01419 Encounter for gynecological examination (general) (routine) without abnormal findings: Secondary | ICD-10-CM | POA: Diagnosis not present

## 2019-04-29 NOTE — Progress Notes (Signed)
48 y.o. G92P2002 Married White or Caucasian female here for annual exam.  Doing well.  Did have video visit with Dr. Caryl Comes in May.  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  She then developed SVT.  now she is on 25mg  metoprolol.  This is well controlled.  She is working from home.  She really likes this.    Patient's last menstrual period was 08/20/2011 (exact date).          Sexually active: Yes.    The current method of family planning is status post hysterectomy.    Exercising: Yes.    walking Smoker:  no  Health Maintenance: Pap:  2012 neg History of abnormal Pap:  no MMG:  02/03/19 [place] left BIRADS3:Probably benign. F/u 6 months.  Already has appt scheduled.   Colonoscopy:  06/2012 Normal. F/u 10 years  BMD:   Never TDaP: 2015  Flu vacc: done  Screening Labs: PCP (will do this with Dr. Warrick Parisian that is required for her work physical)   reports that she has never smoked. She has never used smokeless tobacco. She reports current alcohol use. She reports that she does not use drugs.  Past Medical History:  Diagnosis Date  . Abnormal uterine bleeding   . Complicated migraine [id number]  . Heart murmur   . Migraine    "a few/year" (02/03/2017)  . Migraine with aura 02/12/2013  . Presence of permanent cardiac pacemaker   . Stress    saw neurologist 6/15 for numbess in face and arm  . Ventricular tachycardia seen on cardiac monitor [facility])     Past Surgical History:  Procedure Laterality Date  . CESAREAN SECTION  1994; 1997  . CYSTOSCOPY  09/10/2011   Procedure: CYSTOSCOPY;  Surgeon: Felipa Emory, MD;  Location: [facility];  Service: Gynecology;  Laterality: N/A;  . DILATION AND CURETTAGE OF UTERUS    . ENDOMETRIAL ABLATION     failed - done in MD office  . EP IMPLANTABLE DEVICE N/A 02/07/2016   Procedure: Loop Recorder Insertion;  Surgeon: Deboraha Sprang, MD;  Location: [facility];  Service: Cardiovascular;  Laterality: N/A;  . INSERT / REPLACE / REMOVE  PACEMAKER  02/03/2017  . LAPAROSCOPIC TOTAL HYSTERECTOMY     robotic  . LOOP RECORDER REMOVAL  02/03/2017  . LOOP RECORDER REMOVAL N/A 02/03/2017   Procedure: Loop Recorder Removal;  Surgeon: Deboraha Sprang, MD;  Location: [facility];  Service: Cardiovascular;  Laterality: N/A;  . PACEMAKER IMPLANT N/A 02/03/2017   Procedure: Pacemaker Implant;  Surgeon: Deboraha Sprang, MD;  Location: [facility] [facility];  Service: Cardiovascular;  Laterality: N/A;  . TUBAL LIGATION  1997  . WISDOM TOOTH EXTRACTION      Current Outpatient Medications  Medication Sig Dispense Refill  . Calcium Carbonate-Vit D-Min (CALCIUM 1200 PO) Take by mouth daily.    . fluticasone (CUTIVATE) 0.05 % cream fluticasone propionate 0.05 % topical cream    . metoprolol succinate (TOPROL-XL) 25 MG 24 hr tablet Take 1 tablet (25 mg total) by mouth daily. Take with or immediately following a meal. 30 tablet 11  . Probiotic Product (PROBIOTIC DAILY PO) Take 1 capsule by mouth daily.    Marland Kitchen triamcinolone cream (KENALOG) 0.5 % Apply 1 application topically 3 (three) times daily. 30 g 0  . propranolol ER (INDERAL LA) 60 MG 24 hr capsule propranolol ER 60 mg capsule,24 hr,extended release     No current facility-administered medications for  this visit.     Family History  Problem Relation Age of Onset  . Diabetes Maternal Grandfather   . Lung cancer Maternal Grandfather   . Diabetes Paternal Grandmother   . Diabetes Paternal Grandfather   . Kidney Stones Father   . Gallbladder disease Mother   . Colon cancer Maternal Aunt 72       second occurence age 29  . Colon cancer Cousin 65       paternal cousin  . Breast cancer Paternal Aunt     Review of Systems  All other systems reviewed and are negative.   Exam:   BP 102/80   Pulse 84   Temp 97.7 F (36.5 C) (Temporal)   Ht 5' 2.5" (1.588 m)   Wt 221 lb (100.2 kg)   LMP 08/20/2011 (Exact Date)   BMI 39.78 kg/m    Height: 5' 2.5" (158.8 cm)  Ht Readings from  Last 3 Encounters:  04/29/19 5' 2.5" (1.588 m)  08/21/18 5' 2.5" (1.588 m)  03/05/18 5' 2.5" (1.588 m)    General appearance: alert, cooperative and appears stated age Head: Normocephalic, without obvious abnormality, atraumatic Neck: no adenopathy, supple, symmetrical, trachea midline and thyroid normal to inspection and palpation Lungs: clear to auscultation bilaterally Breasts: normal appearance, no masses or tenderness Heart: regular rate and rhythm Abdomen: soft, non-tender; bowel sounds normal; no masses,  no organomegaly Extremities: extremities normal, atraumatic, no cyanosis or edema Skin: Skin color, texture, turgor normal. No rashes or lesions Lymph nodes: Cervical, supraclavicular, and axillary nodes normal. No abnormal inguinal nodes palpated Neurologic: Grossly normal   Pelvic: External genitalia:  no lesions              Urethra:  normal appearing urethra with no masses, tenderness or lesions              Bartholins and Skenes: normal                 Vagina: normal appearing vagina with normal color and discharge, no lesions              Cervix: absent              Pap taken: No. Bimanual Exam:  Uterus:  uterus absent              Adnexa: no mass, fullness, tenderness               Rectovaginal: Confirms               Anus:  normal sphincter tone, no lesions   Chaperone was present for exam.  A:  Well Woman with normal exam H/o migraines SVT, followed by cardiology H/o TLH, bilateral salpingectomy/13 Family hx of colon cancer H/o atypical mole on vulva that has been resected  P:   Mammogram guidelines reviewed pap smear not indicated Lab work will be done with PCP Seeing dermatology, Dr. Allyson Sabal Colonoscopy 2013.  Recommended follow up at 52 years. Return annually or prn

## 2019-04-30 ENCOUNTER — Ambulatory Visit (INDEPENDENT_AMBULATORY_CARE_PROVIDER_SITE_OTHER): Payer: BC Managed Care – PPO | Admitting: *Deleted

## 2019-04-30 DIAGNOSIS — R55 Syncope and collapse: Secondary | ICD-10-CM

## 2019-04-30 LAB — CUP PACEART REMOTE DEVICE CHECK
Date Time Interrogation Session: 20201009104741
Implantable Lead Implant Date: 20180716
Implantable Lead Implant Date: 20180716
Implantable Lead Location: 753859
Implantable Lead Location: 753860
Implantable Lead Model: 5076
Implantable Lead Model: 5076
Implantable Pulse Generator Implant Date: 20180716
Pulse Gen Model: 407145
Pulse Gen Serial Number: 69161362

## 2019-05-03 ENCOUNTER — Ambulatory Visit: Payer: BLUE CROSS/BLUE SHIELD | Admitting: Obstetrics & Gynecology

## 2019-05-11 NOTE — Progress Notes (Signed)
Remote pacemaker transmission.   

## 2019-07-30 ENCOUNTER — Ambulatory Visit (INDEPENDENT_AMBULATORY_CARE_PROVIDER_SITE_OTHER): Payer: BC Managed Care – PPO | Admitting: *Deleted

## 2019-07-30 DIAGNOSIS — R55 Syncope and collapse: Secondary | ICD-10-CM

## 2019-07-30 LAB — CUP PACEART REMOTE DEVICE CHECK
Date Time Interrogation Session: 20210108111011
Implantable Lead Implant Date: 20180716
Implantable Lead Implant Date: 20180716
Implantable Lead Location: 753859
Implantable Lead Location: 753860
Implantable Lead Model: 5076
Implantable Lead Model: 5076
Implantable Pulse Generator Implant Date: 20180716
Pulse Gen Model: 407145
Pulse Gen Serial Number: 69161362

## 2019-08-02 DIAGNOSIS — E7800 Pure hypercholesterolemia, unspecified: Secondary | ICD-10-CM | POA: Diagnosis not present

## 2019-08-02 DIAGNOSIS — Z136 Encounter for screening for cardiovascular disorders: Secondary | ICD-10-CM | POA: Diagnosis not present

## 2019-08-02 DIAGNOSIS — Z13220 Encounter for screening for lipoid disorders: Secondary | ICD-10-CM | POA: Diagnosis not present

## 2019-08-02 DIAGNOSIS — Z713 Dietary counseling and surveillance: Secondary | ICD-10-CM | POA: Diagnosis not present

## 2019-08-10 ENCOUNTER — Other Ambulatory Visit: Payer: Self-pay

## 2019-08-10 ENCOUNTER — Other Ambulatory Visit: Payer: Self-pay | Admitting: Obstetrics & Gynecology

## 2019-08-10 ENCOUNTER — Ambulatory Visit
Admission: RE | Admit: 2019-08-10 | Discharge: 2019-08-10 | Disposition: A | Discharge status: Home / Self Care | Payer: BC Managed Care – PPO | Source: Ambulatory Visit | Attending: Obstetrics & Gynecology | Admitting: Obstetrics & Gynecology

## 2019-08-10 DIAGNOSIS — N6320 Unspecified lump in the left breast, unspecified quadrant: Secondary | ICD-10-CM

## 2019-08-10 DIAGNOSIS — N6323 Unspecified lump in the left breast, lower outer quadrant: Secondary | ICD-10-CM | POA: Diagnosis not present

## 2019-08-10 DIAGNOSIS — R928 Other abnormal and inconclusive findings on diagnostic imaging of breast: Secondary | ICD-10-CM | POA: Diagnosis not present

## 2019-10-19 DIAGNOSIS — K625 Hemorrhage of anus and rectum: Secondary | ICD-10-CM | POA: Diagnosis not present

## 2019-10-19 DIAGNOSIS — K5900 Constipation, unspecified: Secondary | ICD-10-CM | POA: Diagnosis not present

## 2019-10-19 DIAGNOSIS — K219 Gastro-esophageal reflux disease without esophagitis: Secondary | ICD-10-CM | POA: Diagnosis not present

## 2019-10-19 DIAGNOSIS — K645 Perianal venous thrombosis: Secondary | ICD-10-CM | POA: Diagnosis not present

## 2019-10-21 DIAGNOSIS — K625 Hemorrhage of anus and rectum: Secondary | ICD-10-CM | POA: Diagnosis not present

## 2019-10-21 DIAGNOSIS — K5900 Constipation, unspecified: Secondary | ICD-10-CM | POA: Diagnosis not present

## 2019-10-21 DIAGNOSIS — K219 Gastro-esophageal reflux disease without esophagitis: Secondary | ICD-10-CM | POA: Diagnosis not present

## 2019-10-21 DIAGNOSIS — K645 Perianal venous thrombosis: Secondary | ICD-10-CM | POA: Diagnosis not present

## 2019-10-29 ENCOUNTER — Ambulatory Visit (INDEPENDENT_AMBULATORY_CARE_PROVIDER_SITE_OTHER): Payer: BC Managed Care – PPO | Admitting: *Deleted

## 2019-10-29 DIAGNOSIS — R55 Syncope and collapse: Secondary | ICD-10-CM

## 2019-10-29 LAB — CUP PACEART REMOTE DEVICE CHECK
Date Time Interrogation Session: 20210409075144
Implantable Lead Implant Date: 20180716
Implantable Lead Implant Date: 20180716
Implantable Lead Location: 753859
Implantable Lead Location: 753860
Implantable Lead Model: 5076
Implantable Lead Model: 5076
Implantable Pulse Generator Implant Date: 20180716
Pulse Gen Model: 407145
Pulse Gen Serial Number: 69161362

## 2019-10-29 NOTE — Progress Notes (Signed)
PPM Remote  

## 2020-01-27 LAB — CUP PACEART REMOTE DEVICE CHECK
Date Time Interrogation Session: 20210709164320
Implantable Lead Implant Date: 20180716
Implantable Lead Implant Date: 20180716
Implantable Lead Location: 753859
Implantable Lead Location: 753860
Implantable Lead Model: 5076
Implantable Lead Model: 5076
Implantable Pulse Generator Implant Date: 20180716
Pulse Gen Model: 407145
Pulse Gen Serial Number: 69161362

## 2020-01-28 ENCOUNTER — Ambulatory Visit (INDEPENDENT_AMBULATORY_CARE_PROVIDER_SITE_OTHER): Payer: BC Managed Care – PPO | Admitting: *Deleted

## 2020-01-28 DIAGNOSIS — R55 Syncope and collapse: Secondary | ICD-10-CM | POA: Diagnosis not present

## 2020-02-01 NOTE — Progress Notes (Signed)
Remote pacemaker transmission.   

## 2020-02-14 ENCOUNTER — Ambulatory Visit
Admission: RE | Admit: 2020-02-14 | Discharge: 2020-02-14 | Disposition: A | Discharge status: Home / Self Care | Payer: BC Managed Care – PPO | Source: Ambulatory Visit | Attending: Obstetrics & Gynecology | Admitting: Obstetrics & Gynecology

## 2020-02-14 ENCOUNTER — Ambulatory Visit: Payer: BC Managed Care – PPO

## 2020-02-14 ENCOUNTER — Other Ambulatory Visit: Payer: Self-pay

## 2020-02-14 DIAGNOSIS — N6320 Unspecified lump in the left breast, unspecified quadrant: Secondary | ICD-10-CM

## 2020-02-14 DIAGNOSIS — R928 Other abnormal and inconclusive findings on diagnostic imaging of breast: Secondary | ICD-10-CM | POA: Diagnosis not present

## 2020-03-06 ENCOUNTER — Encounter: Payer: Self-pay | Admitting: Obstetrics & Gynecology

## 2020-03-09 ENCOUNTER — Telehealth: Payer: Self-pay | Admitting: Allergy & Immunology

## 2020-03-09 ENCOUNTER — Telehealth: Payer: Self-pay | Admitting: *Deleted

## 2020-03-09 ENCOUNTER — Other Ambulatory Visit: Payer: Self-pay | Admitting: Obstetrics & Gynecology

## 2020-03-09 DIAGNOSIS — Z0182 Encounter for allergy testing: Secondary | ICD-10-CM

## 2020-03-09 NOTE — Telephone Encounter (Signed)
Spoke with Sharee Pimple from Dr. 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   Patient had a reaction to a bowel prep a few years ago and would like to get the COVID vaccine. Patient would like to know which vaccine to get, depending if she is allergic to polyethylene glycol.  Please advise if initial appointment is needed for polyethylene glycol testing or if full COVID component testing is needed.

## 2020-03-09 NOTE — Telephone Encounter (Signed)
Spoke with Erika [name].  Was advised to send referral, provider will review and determine if PEG testing is needed or Covid component testing. They will then contact the patient directly to schedule.   Referral placed.   Patient notified. Advised patient [facility] will contact her directly after the provider has reviewed the referral, they will notify her of testing recommendations for day of appt. Patient verbalizes understanding and is agreeable.   Routing to provider for final review. Patient is agreeable to disposition. Will close encounter.  Cc: Erika [name]

## 2020-03-09 NOTE — Progress Notes (Signed)
Called pt personally.  With Moviprep, oral prep for colonoscopy, pt had reaction after second dosing which included nausea and vomiting for 8 hours that was associated with a rash that developed quickly after drinking the second dosage.  She felt physical ill for a few days afterwards.  This contained polyethylene glycol.  She would like one of the mRNA vaccines but these contain PEG and allergy to PEG is a contraindication for administration.  We don't completley know if the PEG was the cause of the reaction with the Moviprep.  Can you call [facility] ([address], [place], [place] [address], Office : [phone]) and see if they can do testing for polyethylene glycol to see if she is really allergic to this.  If so, then only option for Covid vaccination is the J&J vaccine.

## 2020-03-09 NOTE — Telephone Encounter (Signed)
Erika Salon, MD routed conversation to You 1 hour ago (12:46 PM)  Erika Salon, MD 1 hour ago (12:46 PM)     Called pt personally.  With Moviprep, oral prep for colonoscopy, pt had reaction after second dosing which included nausea and vomiting for 8 hours that was associated with a rash that developed quickly after drinking the second dosage.  She felt physical ill for a few days afterwards.  This contained polyethylene glycol.  She would like one of the mRNA vaccines but these contain PEG and allergy to PEG is a contraindication for administration.  We don't completley know if the PEG was the cause of the reaction with the Moviprep.  Can you call [facility] ([address], [place], [place] [address], Office : [phone]) and see if they can do testing for polyethylene glycol to see if she is really allergic to this.  If so, then only option for Covid vaccination is the J&J vaccine.     [name] to Erika Salon, MD     03/06/20 10:10 AM Good morning So I have been back and forth about the vaccine. I wanted to ask for your help. I had a bad reaction when I had a colonoscopy scheduled a few years ago. The moviprep I had to drink immediately made me sick. I broke out in a rash and was throwing up all night. I looked up the ingredients and it contains polyethylene glycol 3350. The vaccines contain polyethylene glycol 2000. Both Pfizer and moderna have that. Do you think I would be ok to take either of those?  And is one of them better than the other. I don't want the j and j at all if I can help it.

## 2020-03-09 NOTE — Telephone Encounter (Signed)
Let's just add her for COVID complement testing. We are doing a large one on Monday August 30th in [place].  Salvatore Marvel, MD [facility]

## 2020-03-09 NOTE — Telephone Encounter (Signed)
Please advise 

## 2020-03-09 NOTE — Telephone Encounter (Signed)
Patient scheduled in [facility] 8/30 at 12pm.

## 2020-03-20 ENCOUNTER — Other Ambulatory Visit: Payer: Self-pay

## 2020-03-20 ENCOUNTER — Encounter: Payer: Self-pay | Admitting: Allergy & Immunology

## 2020-03-20 ENCOUNTER — Ambulatory Visit: Payer: BC Managed Care – PPO | Admitting: Allergy & Immunology

## 2020-03-20 VITALS — BP 110/78 | HR 90 | Temp 97.3°F | Resp 17 | Ht 63.0 in | Wt 224.5 lb

## 2020-03-20 DIAGNOSIS — T50Z95D Adverse effect of other vaccines and biological substances, subsequent encounter: Secondary | ICD-10-CM

## 2020-03-20 DIAGNOSIS — T50B95D Adverse effect of other viral vaccines, subsequent encounter: Secondary | ICD-10-CM

## 2020-03-20 NOTE — Patient Instructions (Addendum)
1. Vaccines and biological substances causing adverse effect in therapeutic use, subsequent encounter - Testing today was negative, including the challenge to the Miralax (PEG).  - I think that you will tolerate the COVID vaccine well. - There is currently no definite skin testing available for the Pfizer and Moderna COVID vaccines and data is limited however, there has been a concern regarding sensitivity to PEG in patients who had anaphylactic reactions to the COVID-19 vaccine. - Skin testing was negative to Miralax (source of PEG 3350), methylprednisolone acetate (source of PEG 3350), and triamcinolone acetonide (source of polysorbate-80). - Miralax contains PEG as well (same as Moviprep), so you must have reacted to something else in the Moviprep itself (dye, flavoring, etc).  - Ideally, after a negative skin testing a test dose of the vaccine is given in the office. - You already have an appointment for the [facility] vaccination at [place], which is great!  - Call us tomorrow and let us know how it goes!   2. Follow up as needed.   Please inform us of any Emergency Department visits, hospitalizations, or changes in symptoms. Call us before going to the ED for breathing or allergy symptoms since we might be able to fit you in for a sick visit. Feel free to contact us anytime with any questions, problems, or concerns.  It was a pleasure to meet you today!  Websites that have reliable patient information: 1. American Academy of Asthma, Allergy, and Immunology: www.aaaai.org 2. Food Allergy Research and Education (FARE): foodallergy.org 3. Mothers of Asthmatics: http://www.asthmacommunitynetwork.org 4. American College of Allergy, Asthma, and Immunology: www.acaai.org   COVID-19 Vaccine Information can be found at: ShippingScam.co.uk For questions related to vaccine distribution or appointments, please email [email or web address] or call  [phone].     "Like" [place] on Facebook and Instagram for our latest updates!        Make sure you are registered to vote! If you have moved or changed any of your contact information, you will need to get this updated before voting!  In some cases, you MAY be able to register to vote online: [email or web address]

## 2020-03-20 NOTE — Progress Notes (Signed)
NEW PATIENT  Date of Service/Encounter:  03/20/20  Referring provider: Dettinger, Fransisca Kaufmann, MD   Assessment:   Vaccines and biological substances causing adverse effect - negative skin testing (going for Moderna vaccine today)  Plan/Recommendations:   1. Vaccines and biological substances causing adverse effect - Testing today was negative, including the challenge to the Miralax (PEG).  - I think that you will tolerate the COVID vaccine well. - There is currently no definite skin testing available for the Pfizer and Moderna COVID vaccines and data is limited however, there has been a concern regarding sensitivity to PEG in patients who had anaphylactic reactions to the COVID-19 vaccine. - Skin testing was negative to Miralax (source of PEG 3350), methylprednisolone acetate (source of PEG 3350), and triamcinolone acetonide (source of polysorbate-80). - Miralax contains PEG as well (same as Moviprep), so you must have reacted to something else in the Moviprep itself (dye, flavoring, etc).  - Ideally, after a negative skin testing a test dose of the vaccine is given in the office. - You already have an appointment for the [facility] vaccination at [place], which is great!  - Call us tomorrow and let us know how it goes!   2. Follow up as needed.  Subjective:   DEVLYN PARISH is a 49 y.o. female presenting today for evaluation of  Chief Complaint  Patient presents with  . Allergy Testing    Covid Vaccine Testing    TZIPPY TESTERMAN has a history of the following: Patient Active Problem List   Diagnosis Date Noted  . 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 Syncope 02/07/2016  . Migraine with aura 02/12/2013  . Complicated migraine 75/17/0017    History obtained from: chart review and patient.  [name] was referred by Dettinger, Fransisca Kaufmann, MD.     Rosaisela is a 49 y.o. 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  She contacted [place] on August 19 and reported a history of reaction to a  bowel prep a few years ago.  Therefore, she was flagged as high risk for COVID-19 vaccine reaction.  She presents today for testing to the COVID-19 vaccine components.  She was drinking several glasses of the bowel prep. She did fine with the first glass of this. Then 15 minutes later she drank another glass and developed urticaria and vomiting. She threw up for 8 hours afterwards. This was six years ago. This was Moviprep. She then she was given Miralax with Gatorade and tolerated this fine when she reacted to the Moviprep. I did confirm that both of these formulations contain PEG 3350.   She works at [employer] [place]. She works in Warehouse manager. They are requiring testing twice weekly if you are not vaccinated. She does not want to be tested twice weekly. Therefore she would like to go ahead and get testing for a vaccination. She has an appointment to get Moderna this afternoon after the testing.   Otherwise, there is no history of other atopic diseases, including asthma, food allergies, environmental allergies, stinging insect allergies, eczema, urticaria or contact dermatitis. There is no significant infectious history. Vaccinations are up to date.    Past Medical History: Patient Active Problem List   Diagnosis Date Noted  . 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 Syncope 02/07/2016  . Migraine with aura 02/12/2013  . Complicated migraine 49/44/9675    Medication List:  Allergies as of 03/20/2020      Reactions   Penicillins Rash   Has patient  had a PCN reaction causing immediate rash, facial/tongue/throat swelling, SOB or lightheadedness with hypotension: Yes Has patient had a PCN reaction causing severe rash involving mucus membranes or skin necrosis: No Has patient had a PCN reaction that required hospitalization No Has patient had a PCN reaction occurring within the last 10 years: Yes If all of the above answers are "NO", then may proceed with Cephalosporin use. Has patient had a PCN  reaction causing immediate rash, facial/tongue/throat swelling, SOB or lightheadedness with hypotension: Yes Has patient had a PCN reaction causing severe rash involving mucus membranes or skin necrosis: No Has patient had a PCN reaction that required hospitalization No Has patient had a PCN reaction occurring within the last 10 years: Yes If all of the above answers are "NO", then may proceed with Cephalosporin use.   Polyethylene Anxiety, Hives, Itching, Nausea And Vomiting, Palpitations   Zomig [zolmitriptan] Rash      Medication List       Accurate as of March 20, 2020  2:51 PM. If you have any questions, ask your nurse or doctor.        CALCIUM 1200 PO Take by mouth daily.   fluticasone 0.05 % cream Commonly known as: CUTIVATE fluticasone propionate 0.05 % topical cream   metoprolol succinate 25 MG 24 hr tablet Commonly known as: TOPROL-XL Take 1 tablet (25 mg total) by mouth daily. Take with or immediately following a meal.   PROBIOTIC DAILY PO Take 1 capsule by mouth daily.   triamcinolone cream 0.5 % Commonly known as: KENALOG Apply 1 application topically 3 (three) times daily.       Birth History: non-contributory  Developmental History: non-contributory  Past Surgical History: Past Surgical History:  Procedure Laterality Date  . CESAREAN SECTION  1994; 1997  . CYSTOSCOPY  09/10/2011   Procedure: CYSTOSCOPY;  Surgeon: Felipa Emory, MD;  Location: [facility];  Service: Gynecology;  Laterality: N/A;  . DILATION AND CURETTAGE OF UTERUS    . ENDOMETRIAL ABLATION     failed - done in MD office  . EP IMPLANTABLE DEVICE N/A 02/07/2016   Procedure: Loop Recorder Insertion;  Surgeon: Deboraha Sprang, MD;  Location: [facility];  Service: Cardiovascular;  Laterality: N/A;  . INSERT / REPLACE / REMOVE PACEMAKER  02/03/2017  . LAPAROSCOPIC TOTAL HYSTERECTOMY     robotic  . LOOP RECORDER REMOVAL  02/03/2017  . LOOP RECORDER REMOVAL N/A 02/03/2017   Procedure:  Loop Recorder Removal;  Surgeon: Deboraha Sprang, MD;  Location: [facility];  Service: Cardiovascular;  Laterality: N/A;  . PACEMAKER IMPLANT N/A 02/03/2017   Procedure: Pacemaker Implant;  Surgeon: Deboraha Sprang, MD;  Location: [facility];  Service: Cardiovascular;  Laterality: N/A;  . TUBAL LIGATION  1997  . WISDOM TOOTH EXTRACTION       Family History: Family History  Problem Relation Age of Onset  . Diabetes Maternal Grandfather   . Lung cancer Maternal Grandfather   . Diabetes Paternal Grandmother   . Diabetes Paternal Grandfather   . Kidney Stones Father   . Gallbladder disease Mother   . Colon cancer Maternal Aunt 18       second occurence age 48  . Colon cancer Cousin 19       paternal cousin  . Breast cancer Paternal Aunt      Social History: Hedaya lives at home with her family. She works at [employer] [place]. She does not smoke.  Review of Systems  Constitutional: Negative.  Negative for chills, fever, malaise/fatigue and weight loss.  HENT: Negative.  Negative for congestion, ear discharge and ear pain.   Eyes: Negative for pain, discharge and redness.  Respiratory: Negative for cough, sputum production, shortness of breath and wheezing.   Cardiovascular: Negative.  Negative for chest pain and palpitations.  Gastrointestinal: Negative for abdominal pain, constipation, diarrhea, heartburn, nausea and vomiting.  Skin: Negative.  Negative for itching and rash.  Neurological: Negative for dizziness and headaches.  Endo/Heme/Allergies: Negative for environmental allergies. Does not bruise/bleed easily.       Objective:   Blood pressure 110/78, pulse 90, temperature (!) 97.3 F (36.3 C), temperature source Temporal, resp. rate 17, height 5\' 3"  (1.6 m), weight 224 lb 8 oz (101.8 kg), last menstrual period 08/20/2011, SpO2 97 %. Body mass index is 39.77 kg/m.    Physical Exam Constitutional:      Appearance: She is well-developed.  HENT:      Head: Normocephalic and atraumatic.     Right Ear: Tympanic membrane, ear canal and external ear normal. No drainage, swelling or tenderness. Tympanic membrane is not injected, scarred, erythematous, retracted or bulging.     Left Ear: Tympanic membrane, ear canal and external ear normal. No drainage, swelling or tenderness. Tympanic membrane is not injected, scarred, erythematous, retracted or bulging.     Nose: No nasal deformity, septal deviation, mucosal edema or rhinorrhea.     Right Sinus: No maxillary sinus tenderness or frontal sinus tenderness.     Left Sinus: No maxillary sinus tenderness or frontal sinus tenderness.     Mouth/Throat:     Mouth: Mucous membranes are not pale and not dry.     Pharynx: Uvula midline.  Eyes:     General:        Right eye: No discharge.        Left eye: No discharge.     Conjunctiva/sclera: Conjunctivae normal.     Right eye: Right conjunctiva is not injected. No chemosis.    Left eye: Left conjunctiva is not injected. No chemosis.    Pupils: Pupils are equal, round, and reactive to light.  Cardiovascular:     Rate and Rhythm: Normal rate and regular rhythm.     Heart sounds: Normal heart sounds.  Pulmonary:     Effort: Pulmonary effort is normal. No tachypnea, accessory muscle usage or respiratory distress.     Breath sounds: Normal breath sounds. No wheezing, rhonchi or rales.  Chest:     Chest wall: No tenderness.  Abdominal:     Tenderness: There is no abdominal tenderness. There is no guarding or rebound.  Lymphadenopathy:     Head:     Right side of head: No submandibular, tonsillar or occipital adenopathy.     Left side of head: No submandibular, tonsillar or occipital adenopathy.     Cervical: No cervical adenopathy.  Skin:    Coloration: Skin is not pale.     Findings: No abrasion, erythema, petechiae or rash. Rash is not papular, urticarial or vesicular.  Neurological:     Mental Status: She is alert.      Diagnostic studies:       Allergy Studies:    SKIN TESTING (CPT 95018)  SKIN PRICK TESTING ARM #1 SIZE TIME  1. Histamine (1.8mg /mL) 2+ See scanned flowsheet  2. Control (negative - HSA) Negative See scanned flowsheet  3. Triamcinolone (40mg /mL) Negative See scanned flowsheet  4. Methylprednisolone (40mg /mL) Negative See  scanned flowsheet  WAIT 15 MINUTES  5. Miralax (1:100 or 1.7mg /mL) Negative See scanned flowsheet  WAIT 15 MINUTES  6. Miralax (1:10 or 17mg /mL) Negative See scanned flowsheet  WAIT 15 MINUTES  7. Miralax (1:1 or 170mg /mL) Negative See scanned flowsheet  WAIT 15 MINUTES   PUNCTURE TEST TOTAL 7    INTRADERMAL TESTING ARM #2 SIZE TIME  1. Control (negative - HSA) Negative See scanned flowsheet  2. Triamcinolone (1:100) Negative See scanned flowsheet  3. Methylprednisolone (1:100) Negative See scanned flowsheet  WAIT 15 MINUTES  4. Triamcinolone (1:10) Negative See scanned flowsheet  5. Methylprednisolone (1:10) Negative See scanned flowsheet  WAIT 15 MINUTES  6. Triamcinolone (1:1) Negative See scanned flowsheet  WAIT 15 MINUTES   INTRADERMAL TEST TOTAL 6    ORAL CHALLENGE (CPT 95076)  DOSE Pre-Vitals (BP/HR/Resp) TIME GIVEN  1. Miralax 170mg /mL suspension 0.24mL  Within normal limits See scanned flow sheet   See scanned flow sheet   WAIT 20 MINUTES  2. Miralax 170mg /mL suspension 52mL  Within normal limits See scanned flow sheet   See scanned flow sheet   WAIT 20 MINUTES  3. Miralax 170mg /mL suspension 17mL  Within normal limits See scanned flow sheet   See scanned flow sheet   WAIT 30-60 MINUTES (To be determined by the provider)   Post-Vitals (BP/HR/Resp)  Within normal limits See scanned flow sheet END TIME  See scanned flow sheet     Total Time     Allergy testing results were read and interpreted by myself, documented by clinical staff.         Salvatore Marvel, MD [facility]

## 2020-04-11 DIAGNOSIS — L821 Other seborrheic keratosis: Secondary | ICD-10-CM | POA: Diagnosis not present

## 2020-04-11 DIAGNOSIS — L905 Scar conditions and fibrosis of skin: Secondary | ICD-10-CM | POA: Diagnosis not present

## 2020-04-11 DIAGNOSIS — R748 Abnormal levels of other serum enzymes: Secondary | ICD-10-CM | POA: Diagnosis not present

## 2020-04-11 DIAGNOSIS — L814 Other melanin hyperpigmentation: Secondary | ICD-10-CM | POA: Diagnosis not present

## 2020-04-11 DIAGNOSIS — D485 Neoplasm of uncertain behavior of skin: Secondary | ICD-10-CM | POA: Diagnosis not present

## 2020-04-11 DIAGNOSIS — D225 Melanocytic nevi of trunk: Secondary | ICD-10-CM | POA: Diagnosis not present

## 2020-04-28 ENCOUNTER — Ambulatory Visit (INDEPENDENT_AMBULATORY_CARE_PROVIDER_SITE_OTHER): Payer: BC Managed Care – PPO

## 2020-04-28 DIAGNOSIS — R55 Syncope and collapse: Secondary | ICD-10-CM | POA: Diagnosis not present

## 2020-04-28 LAB — CUP PACEART REMOTE DEVICE CHECK
Date Time Interrogation Session: 20211008071241
Implantable Lead Implant Date: 20180716
Implantable Lead Implant Date: 20180716
Implantable Lead Location: 753859
Implantable Lead Location: 753860
Implantable Lead Model: 5076
Implantable Lead Model: 5076
Implantable Pulse Generator Implant Date: 20180716
Pulse Gen Model: 407145
Pulse Gen Serial Number: 69161362

## 2020-05-02 NOTE — Progress Notes (Signed)
Remote pacemaker transmission.   

## 2020-05-04 ENCOUNTER — Telehealth: Payer: Self-pay

## 2020-05-04 NOTE — Telephone Encounter (Signed)
Routing to Dr Sabra Heck for review, please advise

## 2020-05-04 NOTE — Telephone Encounter (Signed)
Left message for pt to return call to triage RN. 

## 2020-05-04 NOTE — Telephone Encounter (Signed)
Is there anyway to do a virtual visit with pt and for me to see what is needed from a work accomodation standpoint?  Thanks.

## 2020-05-04 NOTE — Telephone Encounter (Signed)
Pt sent following mychart message:  Erika [name], [place], MD 5 hours ago (9:00 AM)   Good morning I got my vaccine and all is good there. They are starting to bring people back in the office at work. I have been having a lot of anxiety about it. My chest hurts and I can tell my bp is up. I have trouble sleeping at night. Earlier this week a woman in our office died that was fully vaccinated. Several others have tested positive. From what others are saying they have not done anything to ensure we are safe in the office other than give everyone masks and wipes to clean their desks. Are you still willing to fill out an accommodation form when I get the email to come back to work?  I don't feel with my pacemaker / underlying conditions it is safe for me and my anxiety is through the roof. I do know they have allowed some to work from home indefinitely with the form completed. I honestly don't know how I would handle it being in the office as upsetting as it is for me now.  To even think of the increased risk 8 hours a day 5 days a week has me in tears. It's affecting my ability to concentrate on my job just dreading them telling me to come back in there. I have gone out of my way 18 months now to stay well and even with the vaccine knowing I would be sitting right next to someone that could expose me is just too much to handle. I know you are changing offices. If I get the email to come back during the two months transition time should I reach out to someone at [facility] or at your new office?  Thank you for all you do    Mergenthaler, [name], MD 1 month ago   Good morning So I have been back and forth about the vaccine. I wanted to ask for your help. I had a bad reaction when I had a colonoscopy scheduled a few years ago. The moviprep I had to drink immediately made me sick. I broke out in a rash and was throwing up all night. I looked up the ingredients and it contains  polyethylene glycol 3350. The vaccines contain polyethylene glycol 2000. Both Pfizer and moderna have that. Do you think I would be ok to take either of those?  And is one of them better than the other. I don't want the j and j at all if I can help it.

## 2020-05-05 NOTE — Telephone Encounter (Signed)
Spoke with pt. Pt given update and recommendations per Dr Sabra Heck. Pt scheduled for Mychart visit on 10/26 at 1245 pm . Pt agreeable to date and time of virtual visit. No other appts available before transition.  Routing to Dr Sabra Heck for review.

## 2020-05-16 ENCOUNTER — Telehealth (INDEPENDENT_AMBULATORY_CARE_PROVIDER_SITE_OTHER): Payer: BC Managed Care – PPO | Admitting: Obstetrics & Gynecology

## 2020-05-16 DIAGNOSIS — F411 Generalized anxiety disorder: Secondary | ICD-10-CM

## 2020-05-16 NOTE — Progress Notes (Signed)
Virtual Visit via Video Note  I connected with Erika [name] on 05/16/20 at 12:45 PM EDT by a video enabled telemedicine application and verified that I am speaking with the correct person using two identifiers.  Location: Patient: home Provider: office   Pt understands the limitations of evaluation and management by telemedicine and the availability of in person appointments. The patient expressed understanding and agreed to proceed.  History of Present Illness: 49 yo G3P2 MWF who is here to discuss worsening anxiety with possibility of returning to work.  Work Water engineer.  Pt was concerned about possible allergic reaction/risks.  [facility] and did ultimately decide to be vaccinated and had [place] vaccination.  Work is now requiring all employees return.  Has been able to fulfill her work functions very well from home.  Work space includes locations for multiple people without any dividers.  Recently, five employees tested positive.  She's tearful today and expresses many of her concerns.    Family is going to the lake in [place].  She has not done any significant traveling.  She and husband love to cruise.  She reports they've been offered several free or very inexpensive cruises and she has no desire to take one right now.  Being at [place] is good, however.   Observations/Objective: WNWD, NAD  GAD-7 completed with pt today with score: 15/21  Assessment: Severe generalized anxiety that seems to be related to having to return to work  Plan: D/w pt findings from GAD 7 and need for treatment.  Will start with lexapro 10mg .  She will take 1/2 tab for for 4 days and then increase dosage.  May have nausea.  Will need to recheck 3 weeks.  Pt is to give update.   Therapy discussed.  Pt declines for now. Accomodation form for work completed including possible continue working from home or having isolated location at work where she can work independently and  keep door shut.  Will fax this to employer.   I discussed the assessment and treatment plan with the patient. The patient was provided an opportunity to ask questions and all were answered. The patient agreed with the plan and demonstrated an understanding of the instructions.   The patient was advised to call back or seek an in-person evaluation if the symptoms worsen or if the condition fails to improve as anticipated.  I provided 22 minutes of non-face-to-face time during this encounter.   Megan Salon, MD

## 2020-05-18 ENCOUNTER — Encounter: Payer: Self-pay | Admitting: Obstetrics & Gynecology

## 2020-05-19 ENCOUNTER — Other Ambulatory Visit: Payer: Self-pay | Admitting: Obstetrics & Gynecology

## 2020-05-19 ENCOUNTER — Encounter: Payer: Self-pay | Admitting: Obstetrics & Gynecology

## 2020-05-19 ENCOUNTER — Telehealth: Payer: Self-pay | Admitting: *Deleted

## 2020-05-19 DIAGNOSIS — F411 Generalized anxiety disorder: Secondary | ICD-10-CM | POA: Insufficient documentation

## 2020-05-19 MED ORDER — ESCITALOPRAM OXALATE 10 MG PO TABS: ORAL_TABLET | ORAL | 2 refills | Status: DC

## 2020-05-19 NOTE — Telephone Encounter (Signed)
Leena, Tiede W  P [facility] Good morning  I just wanted to follow up on our visit earlier this week. I know you wanted to touch base again in a couple weeks. Was the prescription sent to [facility]? Also was the form faxed in? I have not heard anything back from our HR group saying they received it.  Thank you  Tonda

## 2020-05-19 NOTE — Telephone Encounter (Signed)
See telephone encounter dated 05/19/20.   Encounter closed.

## 2020-05-19 NOTE — Telephone Encounter (Signed)
Spoke with pt. Pt given update on forms and Rx. Pt agreeable and verbalized understanding.  Encounter closed

## 2020-05-19 NOTE — Telephone Encounter (Signed)
Reviewed with Dr. Sabra Heck.   Call returned to patient. Patient states when working on site she is not isolated. 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 Advised patient accomodation forms completed by Dr. Sabra Heck. Patient request forms to be faxed to the number provided on the form and original mailed to her address on file. Also advised Dr. Sabra Heck will [facility]. Patient verbalizes understanding and is agreeable.   Forms faxed.  Original placed in [place] mail to patient at address on file.  Copy of accomodation forms to scan.   Routing to Dr. Sabra Heck for final review and Lexapro Rx.

## 2020-05-19 NOTE — Telephone Encounter (Signed)
Rx sent to pharmacy on file for lexapro 10mg  daily.

## 2020-05-29 NOTE — Progress Notes (Deleted)
Office Visit Note  Patient: Erika [name]             Date of Birth: 1971-07-07           MRN: [id number]             PCP: Patient, No Pcp Per Referring: Juanita Craver, MD Visit Date: 06/12/2020 Occupation: @GUAROCC @  Subjective:  No chief complaint on file.   History of Present Illness: Erika [name] is a 49 y.o. female ***   Activities of Daily Living:  Patient reports morning stiffness for *** {minute/hour:19697}.   Patient {ACTIONS;DENIES/REPORTS:21021675::"Denies"} nocturnal pain.  Difficulty dressing/grooming: {ACTIONS;DENIES/REPORTS:21021675::"Denies"} Difficulty climbing stairs: {ACTIONS;DENIES/REPORTS:21021675::"Denies"} Difficulty getting out of chair: {ACTIONS;DENIES/REPORTS:21021675::"Denies"} Difficulty using hands for taps, buttons, cutlery, and/or writing: {ACTIONS;DENIES/REPORTS:21021675::"Denies"}  No Rheumatology ROS completed.   PMFS History:  Patient Active Problem List   Diagnosis Date Noted  . GAD (generalized anxiety disorder) 05/19/2020  . Carpal tunnel syndrome of left wrist 09/04/2018  . 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 Syncope 02/07/2016  . Migraine with aura 02/12/2013  . Complicated migraine 17/40/8144    Past Medical History:  Diagnosis Date  . Abnormal uterine bleeding   . Complicated migraine 03/08/5630  . Heart murmur   . Migraine with aura 02/12/2013  . Presence of permanent cardiac pacemaker   . Stress    saw neurologist 6/15 for numbess in face and arm  . Ventricular tachycardia seen on cardiac monitor [facility])     Family History  Problem Relation Age of Onset  . Diabetes Maternal Grandfather   . Lung cancer Maternal Grandfather   . Diabetes Paternal Grandmother   . Diabetes Paternal Grandfather   . Kidney Stones Father   . Gallbladder disease Mother   . Colon cancer Maternal Aunt 87       second occurence age 87  . Colon cancer Cousin 54       paternal cousin  . Breast cancer Paternal Aunt    Past Surgical History:   Procedure Laterality Date  . CESAREAN SECTION  1994; 1997  . CYSTOSCOPY  09/10/2011   Procedure: CYSTOSCOPY;  Surgeon: Felipa Emory, MD;  Location: [facility];  Service: Gynecology;  Laterality: N/A;  . DILATION AND CURETTAGE OF UTERUS    . ENDOMETRIAL ABLATION     failed - done in MD office  . EP IMPLANTABLE DEVICE N/A 02/07/2016   Procedure: Loop Recorder Insertion;  Surgeon: Deboraha Sprang, MD;  Location: [facility];  Service: Cardiovascular;  Laterality: N/A;  . INSERT / REPLACE / REMOVE PACEMAKER  02/03/2017  . LAPAROSCOPIC TOTAL HYSTERECTOMY     robotic  . LOOP RECORDER REMOVAL  02/03/2017  . LOOP RECORDER REMOVAL N/A 02/03/2017   Procedure: Loop Recorder Removal;  Surgeon: Deboraha Sprang, MD;  Location: [facility];  Service: Cardiovascular;  Laterality: N/A;  . PACEMAKER IMPLANT N/A 02/03/2017   Procedure: Pacemaker Implant;  Surgeon: Deboraha Sprang, MD;  Location: [facility];  Service: Cardiovascular;  Laterality: N/A;  . TUBAL LIGATION  1997  . WISDOM TOOTH EXTRACTION     Social History   Social History Narrative   Caffeine: 1-2 drinks a week    Immunization History  Administered Date(s) Administered  . Influenza-Unspecified 09/03/2016  . Tdap 06/02/2014     Objective: Vital Signs: LMP 08/20/2011 (Exact Date)    Physical Exam   Musculoskeletal Exam: ***  CDAI Exam: CDAI Score: -- Patient Global: --; Provider Global: -- Swollen: --; Tender: --  Joint Exam 06/12/2020   No joint exam has been documented for this visit   There is currently no information documented on the homunculus. Go to the Rheumatology activity and complete the homunculus joint exam.  Investigation: No additional findings.  Imaging: No results found.  Recent Labs: Lab Results  Component Value Date   WBC 9.4 11/30/2017   HGB 14.6 11/30/2017   PLT 252 11/30/2017   NA 139 11/30/2017   K 3.7 11/30/2017   CL 107 11/30/2017   CO2 23 11/30/2017   GLUCOSE 104 (H)  11/30/2017   BUN 13 11/30/2017   CREATININE 0.84 11/30/2017   BILITOT 0.3 02/02/2014   ALKPHOS 108 02/02/2014   AST 26 02/02/2014   ALT 36 (H) 02/02/2014   PROT 6.4 02/02/2014   ALBUMIN 4.5 02/02/2014   CALCIUM 9.4 11/30/2017   GFRAA >60 11/30/2017    Speciality Comments: No specialty comments available.  Procedures:  No procedures performed Allergies: Penicillins and Zomig [zolmitriptan]   Assessment / Plan:     Visit Diagnoses: Polyarthralgia - referred by Dr. Collene Mares  Carpal tunnel syndrome of left wrist  GAD (generalized anxiety disorder)  Deglutition syncope  Complicated migraine  History of gastroesophageal reflux (GERD)  Orders: No orders of the defined types were placed in this encounter.  No orders of the defined types were placed in this encounter.   Face-to-face time spent with patient was *** minutes. Greater than 50% of time was spent in counseling and coordination of care.  Follow-Up Instructions: No follow-ups on file.   Ofilia Neas, PA-C  Note - This record has been created using Dragon software.  Chart creation errors have been sought, but may not always  have been located. Such creation errors do not reflect on  the standard of medical care.

## 2020-06-12 ENCOUNTER — Ambulatory Visit: Payer: BC Managed Care – PPO | Admitting: Rheumatology

## 2020-06-12 DIAGNOSIS — R55 Syncope and collapse: Secondary | ICD-10-CM

## 2020-06-12 DIAGNOSIS — G43109 Migraine with aura, not intractable, without status migrainosus: Secondary | ICD-10-CM

## 2020-06-12 DIAGNOSIS — M2550 Pain in unspecified joint: Secondary | ICD-10-CM

## 2020-06-12 DIAGNOSIS — F411 Generalized anxiety disorder: Secondary | ICD-10-CM

## 2020-06-12 DIAGNOSIS — Z8719 Personal history of other diseases of the digestive system: Secondary | ICD-10-CM

## 2020-06-12 DIAGNOSIS — G5602 Carpal tunnel syndrome, left upper limb: Secondary | ICD-10-CM

## 2020-06-13 ENCOUNTER — Other Ambulatory Visit: Payer: Self-pay | Admitting: Obstetrics & Gynecology

## 2020-07-11 ENCOUNTER — Ambulatory Visit: Payer: BC Managed Care – PPO | Admitting: Rheumatology

## 2020-07-12 ENCOUNTER — Other Ambulatory Visit: Payer: Self-pay | Admitting: Obstetrics & Gynecology

## 2020-07-17 ENCOUNTER — Other Ambulatory Visit: Payer: Self-pay | Admitting: Obstetrics & Gynecology

## 2020-07-26 DIAGNOSIS — M722 Plantar fascial fibromatosis: Secondary | ICD-10-CM | POA: Diagnosis not present

## 2020-07-27 LAB — CUP PACEART REMOTE DEVICE CHECK
Battery Remaining Percentage: 70 %
Brady Statistic RA Percent Paced: 41 %
Brady Statistic RV Percent Paced: 0 %
Date Time Interrogation Session: 20220106135314
Implantable Lead Implant Date: 20180716
Implantable Lead Implant Date: 20180716
Implantable Lead Location: 753859
Implantable Lead Location: 753860
Implantable Lead Model: 5076
Implantable Lead Model: 5076
Implantable Pulse Generator Implant Date: 20180716
Lead Channel Impedance Value: 410 Ohm
Lead Channel Impedance Value: 605 Ohm
Lead Channel Pacing Threshold Amplitude: 0.5 V
Lead Channel Pacing Threshold Amplitude: 0.6 V
Lead Channel Pacing Threshold Pulse Width: 0.4 ms
Lead Channel Pacing Threshold Pulse Width: 0.4 ms
Lead Channel Sensing Intrinsic Amplitude: 14.5 mV
Lead Channel Sensing Intrinsic Amplitude: 3.4 mV
Lead Channel Setting Pacing Amplitude: 2 V
Lead Channel Setting Pacing Amplitude: 2.4 V
Lead Channel Setting Pacing Pulse Width: 0.4 ms
Pulse Gen Model: 407145
Pulse Gen Serial Number: 69161362

## 2020-07-28 ENCOUNTER — Ambulatory Visit (INDEPENDENT_AMBULATORY_CARE_PROVIDER_SITE_OTHER): Payer: BC Managed Care – PPO

## 2020-07-28 DIAGNOSIS — R55 Syncope and collapse: Secondary | ICD-10-CM

## 2020-08-01 ENCOUNTER — Ambulatory Visit: Payer: BC Managed Care – PPO | Admitting: Obstetrics & Gynecology

## 2020-08-11 NOTE — Progress Notes (Signed)
Remote pacemaker transmission.  ° °

## 2020-09-13 NOTE — Progress Notes (Signed)
Cardiology Office Note Date:  09/14/2020  Patient ID:  Jolea, Dolle 02-Nov-1970, MRN [id number] PCP:  Patient, No Pcp Per  Cardiologist/Electrophysiologist: Dr. Caryl Comes    Chief Complaint:  over due visit  History of Present Illness: TARA RUD is a 50 y.o. 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  She is doing well. Since her pacer her near syncope and syncope remain resolved. No CP,palpitations or cardiac awareness. No SOB or DOE. She walks for exercise, weather permitting, denies difficulties with her ADLs  She follows her labs with her GI MD, they monitor lipids as well.  She is pending a rheumatology consult today for joint pains.  Device information Biotronik dual chamber PPM implanted 01/04/2017   Past Medical History:  Diagnosis Date  . Abnormal uterine bleeding   . Complicated migraine 8/56/3149  . Heart murmur   . Migraine with aura 02/12/2013  . Presence of permanent cardiac pacemaker   . Stress    saw neurologist 6/15 for numbess in face and arm  . Ventricular tachycardia seen on cardiac monitor [facility])     Past Surgical History:  Procedure Laterality Date  . CESAREAN SECTION  1994; 1997  . CYSTOSCOPY  09/10/2011   Procedure: CYSTOSCOPY;  Surgeon: Felipa Emory, MD;  Location: [facility];  Service: Gynecology;  Laterality: N/A;  . DILATION AND CURETTAGE OF UTERUS    . ENDOMETRIAL ABLATION     failed - done in MD office  . EP IMPLANTABLE DEVICE N/A 02/07/2016   Procedure: Loop Recorder Insertion;  Surgeon: Deboraha Sprang, MD;  Location: [facility];  Service: Cardiovascular;  Laterality: N/A;  . INSERT / REPLACE / REMOVE PACEMAKER  02/03/2017  . LAPAROSCOPIC TOTAL HYSTERECTOMY     robotic  . LOOP RECORDER  REMOVAL  02/03/2017  . LOOP RECORDER REMOVAL N/A 02/03/2017   Procedure: Loop Recorder Removal;  Surgeon: Deboraha Sprang, MD;  Location: [place] [facility];  Service: Cardiovascular;  Laterality: N/A;  . PACEMAKER IMPLANT N/A 02/03/2017   Procedure: Pacemaker Implant;  Surgeon: Deboraha Sprang, MD;  Location: [facility];  Service: Cardiovascular;  Laterality: N/A;  . TUBAL LIGATION  1997  . WISDOM TOOTH EXTRACTION      Current Outpatient Medications  Medication Sig Dispense Refill  . Calcium Carbonate-Vit D-Min (CALCIUM 1200 PO) Take by mouth daily.    Marland Kitchen escitalopram (LEXAPRO) 10 MG tablet Take 1/2 tab daily for 4 days and then increase to 1 tab daily. 30 tablet 2  . fluticasone (CUTIVATE) 0.05 % cream fluticasone propionate 0.05 % topical cream    . Probiotic Product (PROBIOTIC DAILY PO) Take 1 capsule by mouth daily.    Marland Kitchen triamcinolone cream (KENALOG) 0.5 % Apply 1 application topically 3 (three) times daily. (Patient taking differently: Apply 1 application topically as needed.) 30 g 0  . metoprolol succinate (TOPROL-XL) 25 MG 24 hr tablet Take 1 tablet (25 mg total) by mouth daily. Take with or immediately following a meal. 90 tablet 3   No current facility-administered medications for this visit.    Allergies:   Penicillins and Zomig [zolmitriptan]   Social History:  The patient  reports that she has never smoked. She  has never used smokeless tobacco. She reports current alcohol use. She reports that she does not use drugs.   Family History:  The patient's family history includes Breast cancer in her paternal aunt; Colon cancer (age of onset: 56) in her cousin; Colon cancer (age of onset: 73) in her maternal aunt; Diabetes in her maternal grandfather, paternal grandfather, and paternal grandmother; Gallbladder disease in her mother; Kidney Stones in her father; Lung cancer in her maternal grandfather.  ROS:  Please see the history of present illness.    All other systems are  reviewed and otherwise negative.   PHYSICAL EXAM:  VS:  BP 120/76   Pulse 85   Ht 5\' 3"  (1.6 m)   Wt 223 lb (101.2 kg)   LMP 08/20/2011 (Exact Date)   SpO2 99%   BMI 39.50 kg/m  BMI: Body mass index is 39.5 kg/m. Well nourished, well developed, in no acute distress HEENT: normocephalic, atraumatic Neck: no JVD, carotid bruits or masses Cardiac:  RRR; no significant murmurs, no rubs, or gallops Lungs:  CTA b/l, no wheezing, rhonchi or rales Abd: soft, nontender MS: no deformity or atrophy Ext: no edema Skin: warm and dry, no rash Neuro:  No gross deficits appreciated Psych: euthymic mood, full affect  PPM site is stable, no tethering or discomfort   EKG:  Done today and reviewed by myself shows  SR 85bpm  Device interrogation done today and reviewed by myself:  Battery and lead measurements are good Arrhythmia burden 0% Had a brief AT back in 2019  12/14/2015: TTE Study Conclusions  - Left ventricle: The cavity size was normal. Systolic function was  normal. The estimated ejection fraction was in the range of 55%  to 60%. Wall motion was normal; there were no regional wall  motion abnormalities. Left ventricular diastolic function  parameters were normal.     12/14/2015: EST  The patient walked for 9 minutes and achieved a peak HR of 169.  This is 94% predicted maximal HR  There were no ST or T wave changes to suggest ischemia.      Recent Labs: No results found for requested labs within last 8760 hours.  No results found for requested labs within last 8760 hours.   CrCl cannot be calculated (Patient's most recent lab result is older than the maximum 21 days allowed.).   Wt Readings from Last 3 Encounters:  09/14/20 223 lb (101.2 kg)  03/20/20 224 lb 8 oz (101.8 kg)  04/29/19 221 lb (100.2 kg)     Other studies reviewed: Additional studies/records reviewed today include: summarized above  ASSESSMENT AND PLAN:  1. Neurally mediated  syncope 2. CLS pacer in place     Intact function, no programming changes made     No arrhythmias, looks like she is maintained on metoprolol, 0% VP, BP looks good, no arrhythmias will refill    Disposition: F/u with remotes as usual and in  Clinic in 1 year, sooner if needed  Current medicines are reviewed at length with the patient today.  The patient did not have any concerns regarding medicines.  Venetia Night, PA-C 09/14/2020 8:43 AM     [place] [place] [address] [phone] (office)  [phone] (fax)

## 2020-09-14 ENCOUNTER — Ambulatory Visit: Payer: Self-pay

## 2020-09-14 ENCOUNTER — Encounter: Payer: Self-pay | Admitting: Physician Assistant

## 2020-09-14 ENCOUNTER — Encounter: Payer: Self-pay | Admitting: Rheumatology

## 2020-09-14 ENCOUNTER — Ambulatory Visit: Payer: BC Managed Care – PPO | Admitting: Physician Assistant

## 2020-09-14 ENCOUNTER — Ambulatory Visit: Payer: BC Managed Care – PPO | Admitting: Rheumatology

## 2020-09-14 ENCOUNTER — Other Ambulatory Visit: Payer: Self-pay

## 2020-09-14 VITALS — BP 114/82 | HR 98 | Resp 15 | Ht 63.0 in | Wt 222.0 lb

## 2020-09-14 VITALS — BP 120/76 | HR 85 | Ht 63.0 in | Wt 223.0 lb

## 2020-09-14 DIAGNOSIS — Z950 Presence of cardiac pacemaker: Secondary | ICD-10-CM | POA: Diagnosis not present

## 2020-09-14 DIAGNOSIS — R55 Syncope and collapse: Secondary | ICD-10-CM

## 2020-09-14 DIAGNOSIS — M533 Sacrococcygeal disorders, not elsewhere classified: Secondary | ICD-10-CM

## 2020-09-14 DIAGNOSIS — G8929 Other chronic pain: Secondary | ICD-10-CM | POA: Diagnosis not present

## 2020-09-14 DIAGNOSIS — K5909 Other constipation: Secondary | ICD-10-CM

## 2020-09-14 DIAGNOSIS — M79671 Pain in right foot: Secondary | ICD-10-CM

## 2020-09-14 DIAGNOSIS — Z8719 Personal history of other diseases of the digestive system: Secondary | ICD-10-CM

## 2020-09-14 DIAGNOSIS — M79642 Pain in left hand: Secondary | ICD-10-CM

## 2020-09-14 DIAGNOSIS — K644 Residual hemorrhoidal skin tags: Secondary | ICD-10-CM

## 2020-09-14 DIAGNOSIS — M2550 Pain in unspecified joint: Secondary | ICD-10-CM

## 2020-09-14 DIAGNOSIS — M25562 Pain in left knee: Secondary | ICD-10-CM | POA: Diagnosis not present

## 2020-09-14 DIAGNOSIS — M79641 Pain in right hand: Secondary | ICD-10-CM

## 2020-09-14 DIAGNOSIS — F411 Generalized anxiety disorder: Secondary | ICD-10-CM

## 2020-09-14 DIAGNOSIS — I471 Supraventricular tachycardia: Secondary | ICD-10-CM | POA: Diagnosis not present

## 2020-09-14 DIAGNOSIS — R748 Abnormal levels of other serum enzymes: Secondary | ICD-10-CM

## 2020-09-14 DIAGNOSIS — R5383 Other fatigue: Secondary | ICD-10-CM

## 2020-09-14 DIAGNOSIS — Z8669 Personal history of other diseases of the nervous system and sense organs: Secondary | ICD-10-CM

## 2020-09-14 DIAGNOSIS — M722 Plantar fascial fibromatosis: Secondary | ICD-10-CM

## 2020-09-14 DIAGNOSIS — M79672 Pain in left foot: Secondary | ICD-10-CM

## 2020-09-14 LAB — CUP PACEART INCLINIC DEVICE CHECK
Date Time Interrogation Session: 20220224085014
Implantable Lead Implant Date: 20180716
Implantable Lead Implant Date: 20180716
Implantable Lead Location: 753859
Implantable Lead Location: 753860
Implantable Lead Model: 5076
Implantable Lead Model: 5076
Implantable Pulse Generator Implant Date: 20180716
Lead Channel Impedance Value: 409 Ohm
Lead Channel Impedance Value: 604 Ohm
Lead Channel Pacing Threshold Amplitude: 0.5 V
Lead Channel Pacing Threshold Amplitude: 0.6 V
Lead Channel Pacing Threshold Amplitude: 0.6 V
Lead Channel Pacing Threshold Amplitude: 0.6 V
Lead Channel Pacing Threshold Amplitude: 0.6 V
Lead Channel Pacing Threshold Pulse Width: 0.4 ms
Lead Channel Pacing Threshold Pulse Width: 0.4 ms
Lead Channel Pacing Threshold Pulse Width: 0.4 ms
Lead Channel Pacing Threshold Pulse Width: 0.4 ms
Lead Channel Pacing Threshold Pulse Width: 0.4 ms
Lead Channel Sensing Intrinsic Amplitude: 17.3 mV
Lead Channel Sensing Intrinsic Amplitude: 17.3 mV
Lead Channel Sensing Intrinsic Amplitude: 4.7 mV
Lead Channel Sensing Intrinsic Amplitude: 4.8 mV
Lead Channel Setting Pacing Amplitude: 2 V
Lead Channel Setting Pacing Amplitude: 2.4 V
Lead Channel Setting Pacing Pulse Width: 0.4 ms
Pulse Gen Model: 407145
Pulse Gen Serial Number: 69161362

## 2020-09-14 MED ORDER — METOPROLOL SUCCINATE ER 25 MG PO TB24: 25.0000 mg | ORAL_TABLET | Freq: Every day | ORAL | 3 refills | Status: DC

## 2020-09-14 NOTE — Progress Notes (Signed)
Office Visit Note  Patient: Erika [name]             Date of Birth: 1970-09-02           MRN: [id number]             PCP: Patient, No Pcp Per Referring: Juanita Craver, MD Visit Date: 09/14/2020 Occupation: @GUAROCC @  Subjective:  Pain in multiple joints.   History of Present Illness: Erika [name] is a 50 y.o. right-handed female seen in consultation per request of Dr. Collene Mares for increased joint pain.  According to the patient having her symptoms a started more than 10 years ago with pain in her bilateral feet.  The symptoms are more prominent in her left foot.  She states she was seen by podiatrist who diagnosed her with plantar fasciitis and heel spurs.  She went for physical therapy and change shoes which was helpful.  About 3 years ago her joint pain started again.  She describes pain in her left shoulder joint, elbows, wrist joints, hands, hip joints over trochanter, left knee, ankles and feet.  She states the pain is more prominent on the left than the right side.  The pain comes and goes.  She has noticed occasional swelling in her elbows and her MCPs.  She describes swelling in her third MCP joint.  She states after she had COVID-19 vaccine in fall pain became more intense and she was having nocturnal pain.  She also gives history of lower back pain for many years.  She has seen a chiropractor in the past.  There is positive family history of rheumatoid arthritis in her paternal grandfather.  She is gravida 3, para 2, miscarriage 1.  There is no history of DVTs.  Activities of Daily Living:  Patient reports morning stiffness for 30-60 minutes.   Patient Reports nocturnal pain.  Difficulty dressing/grooming: Denies Difficulty climbing stairs: Reports Difficulty getting out of chair: Reports Difficulty using hands for taps, buttons, cutlery, and/or writing: Reports  Review of Systems  Constitutional: Positive for fatigue. Negative for night sweats, weight gain and weight loss.  HENT:  Negative for mouth sores, trouble swallowing, trouble swallowing, mouth dryness and nose dryness.   Eyes: Positive for pain and dryness. Negative for redness, itching and visual disturbance.  Respiratory: Negative for cough, shortness of breath and difficulty breathing.   Cardiovascular: Negative for chest pain, palpitations, hypertension, irregular heartbeat and swelling in legs/feet.  Gastrointestinal: Positive for constipation. Negative for blood in stool and diarrhea.  Endocrine: Negative for increased urination.  Genitourinary: Negative for difficulty urinating and vaginal dryness.  Musculoskeletal: Positive for arthralgias, joint pain, myalgias, morning stiffness and myalgias. Negative for joint swelling, muscle weakness and muscle tenderness.  Skin: Negative for color change, rash, hair loss, redness, skin tightness, ulcers and sensitivity to sunlight.  Allergic/Immunologic: Negative for susceptible to infections.  Neurological: Positive for headaches. Negative for dizziness, numbness, memory loss, night sweats and weakness.  Hematological: Positive for bruising/bleeding tendency. Negative for swollen glands.  Psychiatric/Behavioral: Positive for sleep disturbance. Negative for depressed mood and confusion. The patient is nervous/anxious.     PMFS History:  Patient Active Problem List   Diagnosis Date Noted  . GAD (generalized anxiety disorder) 05/19/2020  . Carpal tunnel syndrome of left wrist 09/04/2018  . 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 Syncope 02/07/2016  . Migraine with aura 02/12/2013  . Complicated migraine 88/05/314    Past Medical History:  Diagnosis Date  . Abnormal uterine bleeding   .  Complicated migraine 02/13/3663  . Heart murmur   . Migraine with aura 02/12/2013  . Presence of permanent cardiac pacemaker   . Stress    saw neurologist 6/15 for numbess in face and arm  . Ventricular tachycardia seen on cardiac monitor [facility])     Family History  Problem  Relation Age of Onset  . Diabetes Maternal Grandfather   . Lung cancer Maternal Grandfather   . Diabetes Paternal Grandmother   . Diabetes Paternal Grandfather   . Kidney Stones Father   . Gallbladder disease Mother   . Hypertension Mother   . Colon cancer Maternal Aunt 69       second occurence age 66  . Colon cancer Cousin 60       paternal cousin  . Breast cancer Paternal Aunt   . Healthy Daughter   . Healthy Daughter    Past Surgical History:  Procedure Laterality Date  . CESAREAN SECTION  1994; 1997  . CYSTOSCOPY  09/10/2011   Procedure: CYSTOSCOPY;  Surgeon: Felipa Emory, MD;  Location: [facility];  Service: Gynecology;  Laterality: N/A;  . DILATION AND CURETTAGE OF UTERUS    . ENDOMETRIAL ABLATION     failed - done in MD office  . EP IMPLANTABLE DEVICE N/A 02/07/2016   Procedure: Loop Recorder Insertion;  Surgeon: Deboraha Sprang, MD;  Location: [place] [facility];  Service: Cardiovascular;  Laterality: N/A;  . INSERT / REPLACE / REMOVE PACEMAKER  02/03/2017  . LAPAROSCOPIC TOTAL HYSTERECTOMY     robotic  . LOOP RECORDER REMOVAL  02/03/2017  . LOOP RECORDER REMOVAL N/A 02/03/2017   Procedure: Loop Recorder Removal;  Surgeon: Deboraha Sprang, MD;  Location: [place] [facility];  Service: Cardiovascular;  Laterality: N/A;  . PACEMAKER IMPLANT N/A 02/03/2017   Procedure: Pacemaker Implant;  Surgeon: Deboraha Sprang, MD;  Location: [place] [facility];  Service: Cardiovascular;  Laterality: N/A;  . TUBAL LIGATION  1997  . WISDOM TOOTH EXTRACTION     Social History   Social History Narrative   Caffeine: 1-2 drinks a week    Immunization History  Administered Date(s) Administered  . Influenza-Unspecified 09/03/2016  . Moderna Sars-Covid-2 Vaccination 03/15/2020, 04/15/2020  . Tdap 06/02/2014     Objective: Vital Signs: BP 114/82 (BP Location: Right Arm, Patient Position: Sitting, Cuff Size: Normal)   Pulse 98   Resp 15   Ht 5' 3"  (1.6 m)   Wt 222 lb (100.7 kg)   LMP  08/20/2011 (Exact Date)   BMI 39.33 kg/m    Physical Exam Vitals and nursing note reviewed.  Constitutional:      Appearance: She is well-developed and well-nourished.  HENT:     Head: Normocephalic and atraumatic.  Eyes:     Extraocular Movements: EOM normal.     Conjunctiva/sclera: Conjunctivae normal.  Cardiovascular:     Rate and Rhythm: Normal rate and regular rhythm.     Pulses: Intact distal pulses.     Heart sounds: Normal heart sounds.  Pulmonary:     Effort: Pulmonary effort is normal.     Breath sounds: Normal breath sounds.  Abdominal:     General: Bowel sounds are normal.     Palpations: Abdomen is soft.  Musculoskeletal:     Cervical back: Normal range of motion.  Lymphadenopathy:     Cervical: No cervical adenopathy.  Skin:    General: Skin is warm and dry.     Capillary Refill: Capillary refill takes less  than 2 seconds.  Neurological:     Mental Status: She is alert and oriented to person, place, and time.  Psychiatric:        Mood and Affect: Mood and affect normal.        Behavior: Behavior normal.      Musculoskeletal Exam: C-spine, thoracic and lumbar spine with good range of motion.  She has some tenderness over SI joints.  She tenderness over bilateral trochanteric bursa.  Shoulder joints, elbow joints, wrist joints, MCPs PIPs and DIPs with good range of motion with no synovitis and tenderness.  Hip joints, knee joints, ankles, MTPs and PIPs with good range of motion with no synovitis.  She had tenderness over bilateral plantar fascia.  CDAI Exam: CDAI Score: -- Patient Global: --; Provider Global: -- Swollen: --; Tender: -- Joint Exam 09/14/2020   No joint exam has been documented for this visit   There is currently no information documented on the homunculus. Go to the Rheumatology activity and complete the homunculus joint exam.  Investigation: No additional findings.  Imaging: CUP PACEART INCLINIC DEVICE CHECK  Result Date:  09/14/2020 Pacemaker check in clinic. Normal device function. Thresholds, sensing, impedances consistent with previous measurements. Device programmed to maximize longevity. one AT 2019, no ventricular rates noted. Device programmed at appropriate safety margins. Histogram distribution appropriate for patient activity level. Device programmed to optimize intrinsic conduction. Estimated longevity _5.6years__. Patient enrolled in remote follow-. Patient education completed.   Recent Labs: Lab Results  Component Value Date   WBC 9.4 11/30/2017   HGB 14.6 11/30/2017   PLT 252 11/30/2017   NA 139 11/30/2017   K 3.7 11/30/2017   CL 107 11/30/2017   CO2 23 11/30/2017   GLUCOSE 104 (H) 11/30/2017   BUN 13 11/30/2017   CREATININE 0.84 11/30/2017   BILITOT 0.3 02/02/2014   ALKPHOS 108 02/02/2014   AST 26 02/02/2014   ALT 36 (H) 02/02/2014   PROT 6.4 02/02/2014   ALBUMIN 4.5 02/02/2014   CALCIUM 9.4 11/30/2017   GFRAA >60 11/30/2017   October 27, 2019 CBC normal, CMP alkaline phosphatase 136, ALT 39, TSH normal, hepatitis B-, hepatitis C negative, hepatitis C negative  Speciality Comments: No specialty comments available.  Procedures:  No procedures performed Allergies: Penicillins and Zomig [zolmitriptan]   Assessment / Plan:     Visit Diagnoses: Polyarthralgia  Pain in both hands -she complains of pain and discomfort in the bilateral hands.  She gives history of intermittent swelling in her left third MCP joint.  No synovitis or tenderness was noted on my examination today.  I will obtain x-rays and labs.  She was in agreement.  Plan: XR Hand 2 View Right, XR Hand 2 View Left.  X-rays of bilateral hands were unremarkable.  Chronic SI joint pain -she had tenderness on palpation over SI joints.  We discussed obtaining x-rays to evaluate and she was in agreement.  Plan: XR Pelvis 1-2 Views.  X-ray of SI joints was unremarkable.  Chronic pain of left knee -she had discomfort in her left knee  joint.  She wants me to proceed with x-rays.  Plan: XR KNEE 3 VIEW LEFT.  X-ray showed mild chondromalacia patella.  Pain in both feet -she complains of pain and discomfort in the bilateral feet especially her left foot.  No synovitis was noted.  Per her request x-rays will be obtained.  Plan: XR Foot 2 Views Right, XR Foot 2 Views Left.  X-ray showed early osteoarthritic changes.  Plantar fasciitis-she had tenderness on palpation of bilateral plantar fascia.  She is seen podiatrist in the past.  A handout on plantar fasciitis exercises was given.  Elevated alkaline phosphatase level-mild elevation of alkaline phosphatase was noted by Dr. Collene Mares last year.  I will repeat CMP with GFR today.  Fatigue-she can.  CBC with differential, CMP with GFR and CK today.  TSH was normal in the past.  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  Followed by Dr. Adam Phenix  Pacemaker  History of migraine  GAD (generalized anxiety disorder)  History of gastroesophageal reflux (GERD) - Followed by Dr. Collene Mares  Chronic constipation  External hemorrhoids  Orders: Orders Placed This Encounter  Procedures  . XR Hand 2 View Right  . XR KNEE 3 VIEW LEFT  . XR Foot 2 Views Right  . XR Foot 2 Views Left  . XR Pelvis 1-2 Views  . XR Hand 2 View Left  . CBC with Differential/Platelet  . CK  . Rheumatoid factor  . ANA  . HLA-B27 antigen  . Sedimentation rate  . Serum protein electrophoresis with reflex  . CYCLIC CITRUL PEPTIDE ANTIBODY, IGG/IGA  . CMP14+EGFR   No orders of the defined types were placed in this encounter.    Follow-Up Instructions: Return for Polyarthralgia.   Bo Merino, MD  Note - This record has been created using Editor, commissioning.  Chart creation errors have been sought, but may not always  have been located. Such creation errors do not reflect on  the standard of medical  care.

## 2020-09-14 NOTE — Patient Instructions (Signed)
Plantar Fasciitis Rehab Ask your health care provider which exercises are safe for you. Do exercises exactly as told by your health care provider and adjust them as directed. It is normal to feel mild stretching, pulling, tightness, or discomfort as you do these exercises. Stop right away if you feel sudden pain or your pain gets worse. Do not begin these exercises until told by your health care provider. Stretching and range-of-motion exercises These exercises warm up your muscles and joints and improve the movement and flexibility of your foot. These exercises also help to relieve pain. Plantar fascia stretch 1. Sit with your left / right leg crossed over your opposite knee. 2. Hold your heel with one hand with that thumb near your arch. With your other hand, hold your toes and gently pull them back toward the top of your foot. You should feel a stretch on the base (bottom) of your toes, or the bottom of your foot (plantar fascia), or both. 3. Hold this stretch for__________ seconds. 4. Slowly release your toes and return to the starting position. Repeat __________ times. Complete this exercise __________ times a day.   Gastrocnemius stretch, standing This exercise is also called a calf (gastroc) stretch. It stretches the muscles in the back of the upper calf. 1. Stand with your hands against a wall. 2. Extend your left / right leg behind you, and bend your front knee slightly. 3. Keeping your heels on the floor, your toes facing forward, and your back knee straight, shift your weight toward the wall. Do not arch your back. You should feel a gentle stretch in your upper calf. 4. Hold this position for __________ seconds. Repeat __________ times. Complete this exercise __________ times a day.   Soleus stretch, standing This exercise is also called a calf (soleus) stretch. It stretches the muscles in the back of the lower calf. 1. Stand with your hands against a wall. 2. Extend your left / right  leg behind you, and bend your front knee slightly. 3. Keeping your heels on the floor and your toes facing forward, bend your back knee and shift your weight slightly over your back leg. You should feel a gentle stretch deep in your lower calf. 4. Hold this position for __________ seconds. Repeat __________ times. Complete this exercise __________ times a day. Gastroc and soleus stretch, standing step This exercise stretches the muscles in the back of the lower leg. These muscles are in the upper calf (gastrocnemius) and the lower calf (soleus). 1. Stand with the ball of your left / right foot on the front of a step. The ball of your foot is on the walking surface, right under your toes. 2. Keep your other foot firmly on the same step. 3. Hold on to the wall or a railing for balance. 4. Slowly lift your other foot, allowing your body weight to press your heel down over the edge of the front of the step. Keep knee straight and unbent. You should feel a stretch in your calf. 5. Hold this position for __________ seconds. 6. Return both feet to the step. 7. Repeat this exercise with a slight bend in your left / right knee. Repeat __________ times with your left / right knee straight and __________ times with your left / right knee bent. Complete this exercise __________ times a day. Balance exercise This exercise builds your balance and strength control of your arch to help take pressure off your plantar fascia. Single leg stand If this exercise   is too easy, you can try it with your eyes closed or while standing on a pillow. 1. Without shoes, stand near a railing or in a doorway. You may hold on to the railing or door frame as needed. 2. Stand on your left / right foot. Keep your big toe down on the floor and lift the arch of your foot. You should feel a stretch across the bottom of your foot and your arch. Do not let your foot roll inward. 3. Hold this position for __________ seconds. Repeat  __________ times. Complete this exercise __________ times a day. This information is not intended to replace advice given to you by your health care provider. Make sure you discuss any questions you have with your health care provider. Document Revised: 04/20/2020 Document Reviewed: 04/20/2020 Elsevier Patient Education  2021 [employer]  

## 2020-09-14 NOTE — Patient Instructions (Addendum)
Medication Instructions:  Your physician recommends that you continue on your current medications as directed. Please refer to the Current Medication list given to you today.  *If you need a refill on your cardiac medications before your next appointment, please call your pharmacy*   Lab Work: NONE ORDERED  TODAY  If you have labs (blood work) drawn today and your tests are completely normal, you will receive your results only by: . MyChart Message (if you have MyChart) OR . A paper copy in the mail If you have any lab test that is abnormal or we need to change your treatment, we will call you to review the results.   Testing/Procedures: NONE ORDERED  TODAY   Follow-Up: At [facility], you and your health needs are our priority.  As part of our continuing mission to provide you with exceptional heart care, we have created designated Provider Care Teams.  These Care Teams include your primary Cardiologist (physician) and Advanced Practice Providers (APPs -  Physician Assistants and Nurse Practitioners) who all work together to provide you with the care you need, when you need it.  We recommend signing up for the patient portal called "MyChart".  Sign up information is provided on this After Visit Summary.  MyChart is used to connect with patients for Virtual Visits (Telemedicine).  Patients are able to view lab/test results, encounter notes, upcoming appointments, etc.  Non-urgent messages can be sent to your provider as well.   To learn more about what you can do with MyChart, go to [email or web address].    Your next appointment:   1 year(s)  The format for your next appointment:   In Person  Provider:   You may see Dr. Klein  or one of the following Advanced Practice Providers on your designated Care Team:    Amber Seiler, NP  Renee Ursuy, PA-C  Michael "Andy" Tillery, PA-C    Other Instructions   

## 2020-09-16 NOTE — Progress Notes (Signed)
I will discuss results at the follow-up visit.  All autoimmune work-up is negative.  Patient complained of intermittent swelling in her hands at the last visit.  Please schedule ultrasound of bilateral hands to evaluate for joint inflammation if patient agrees.

## 2020-09-18 ENCOUNTER — Encounter (HOSPITAL_BASED_OUTPATIENT_CLINIC_OR_DEPARTMENT_OTHER): Payer: Self-pay

## 2020-09-18 ENCOUNTER — Telehealth: Payer: Self-pay | Admitting: Internal Medicine

## 2020-09-18 NOTE — Telephone Encounter (Signed)
Patient requesting a 6 month letter. Please advise.

## 2020-09-18 NOTE — Telephone Encounter (Signed)
Spoke with pt and advised per Tommye Standard, PA-C she does not have a cardiac reason to honor request for pt to remain working remotely at home.  Pt states she has a great deal of anxiety about returning to the office.  He GYN provider started her on Lexapro a few months ago but does not feel it is helping.  Encouraged pt to reach out to provider to let provider know she does not feel medication is helping.  Advised pt Mrs Charlcie Cradle has forwarded request to Dr Caryl Comes to weigh in on recommendation of pt returning to office.  Pt states she is vaccinated but not boosted and refuses booster due to joint pain since receiving Covid vaccine.  Pt advised once Dr Caryl Comes has reviewed her request will contact pt.  Pt verbalizes understanding and agrees with current plan.

## 2020-09-18 NOTE — Telephone Encounter (Signed)
Patient needed to talk with Dr. Caryl Comes or nurse because she needs to send a letter to her job. Says that Dr. Caryl Comes is not listed as one of her doctors and wants to know why. Please call back

## 2020-09-19 NOTE — Telephone Encounter (Signed)
I returned patient's call and discussed all the lab results.  HLA-B27 is a still pending.  She would like to have an ultrasound of bilateral hands to look for synovitis.

## 2020-09-26 LAB — PROTEIN ELECTROPHORESIS, SERUM, WITH REFLEX
A/G Ratio: 1.3 (ref 0.7–1.7)
Albumin ELP: 3.9 g/dL (ref 2.9–4.4)
Alpha 1: 0.2 g/dL (ref 0.0–0.4)
Alpha 2: 0.7 g/dL (ref 0.4–1.0)
Beta: 1.1 g/dL (ref 0.7–1.3)
Gamma Globulin: 0.9 g/dL (ref 0.4–1.8)
Globulin, Total: 3 g/dL (ref 2.2–3.9)

## 2020-09-26 LAB — CMP14+EGFR
ALT: 27 IU/L (ref 0–32)
AST: 27 IU/L (ref 0–40)
Albumin/Globulin Ratio: 2 (ref 1.2–2.2)
Albumin: 4.6 g/dL (ref 3.8–4.8)
Alkaline Phosphatase: 107 IU/L (ref 44–121)
BUN/Creatinine Ratio: 12 (ref 9–23)
BUN: 11 mg/dL (ref 6–24)
Bilirubin Total: 0.3 mg/dL (ref 0.0–1.2)
CO2: 24 mmol/L (ref 20–29)
Calcium: 9.5 mg/dL (ref 8.7–10.2)
Chloride: 102 mmol/L (ref 96–106)
Creatinine, Ser: 0.94 mg/dL (ref 0.57–1.00)
GFR calc Af Amer: 84 mL/min/{1.73_m2} (ref >59)
GFR calc non Af Amer: 72 mL/min/{1.73_m2} (ref >59)
Globulin, Total: 2.3 g/dL (ref 1.5–4.5)
Glucose: 98 mg/dL (ref 65–99)
Potassium: 4.1 mmol/L (ref 3.5–5.2)
Sodium: 140 mmol/L (ref 134–144)
Total Protein: 6.9 g/dL (ref 6.0–8.5)

## 2020-09-26 LAB — CBC WITH DIFFERENTIAL/PLATELET
Basophils Absolute: 0 10*3/uL (ref 0.0–0.2)
Basos: 0 %
EOS (ABSOLUTE): 0.1 10*3/uL (ref 0.0–0.4)
Eos: 1 %
Hematocrit: 44.4 % (ref 34.0–46.6)
Hemoglobin: 15.3 g/dL (ref 11.1–15.9)
Immature Grans (Abs): 0 10*3/uL (ref 0.0–0.1)
Immature Granulocytes: 0 %
Lymphocytes Absolute: 1.7 10*3/uL (ref 0.7–3.1)
Lymphs: 23 %
MCH: 30.7 pg (ref 26.6–33.0)
MCHC: 34.5 g/dL (ref 31.5–35.7)
MCV: 89 fL (ref 79–97)
Monocytes Absolute: 0.3 10*3/uL (ref 0.1–0.9)
Monocytes: 5 %
Neutrophils Absolute: 5.1 10*3/uL (ref 1.4–7.0)
Neutrophils: 71 %
Platelets: 309 10*3/uL (ref 150–450)
RBC: 4.98 x10E6/uL (ref 3.77–5.28)
RDW: 12.1 % (ref 11.7–15.4)
WBC: 7.2 10*3/uL (ref 3.4–10.8)

## 2020-09-26 LAB — RHEUMATOID FACTOR: Rheumatoid fact SerPl-aCnc: <10 IU/mL (ref <14.0)

## 2020-09-26 LAB — CK: Total CK: 98 U/L (ref 32–182)

## 2020-09-26 LAB — SEDIMENTATION RATE: Sed Rate: 5 mm/hr (ref 0–32)

## 2020-09-26 LAB — HLA-B27 ANTIGEN: HLA B27: NEGATIVE

## 2020-09-26 LAB — ANA: Anti Nuclear Antibody (ANA): NEGATIVE

## 2020-09-26 LAB — CYCLIC CITRUL PEPTIDE ANTIBODY, IGG/IGA: Cyclic Citrullin Peptide Ab: 7 units (ref 0–19)

## 2020-09-29 ENCOUNTER — Other Ambulatory Visit (HOSPITAL_BASED_OUTPATIENT_CLINIC_OR_DEPARTMENT_OTHER): Payer: Self-pay | Admitting: Obstetrics & Gynecology

## 2020-10-06 IMAGING — MG DIGITAL SCREENING BILATERAL MAMMOGRAM WITH TOMO AND CAD
8 series · 8 of 24 positions shown · non-contrast
Comparison: Previous exam(s).

CLINICAL DATA: Screening.

EXAM:
DIGITAL SCREENING BILATERAL MAMMOGRAM WITH TOMO AND CAD

[R CC synth-2D]
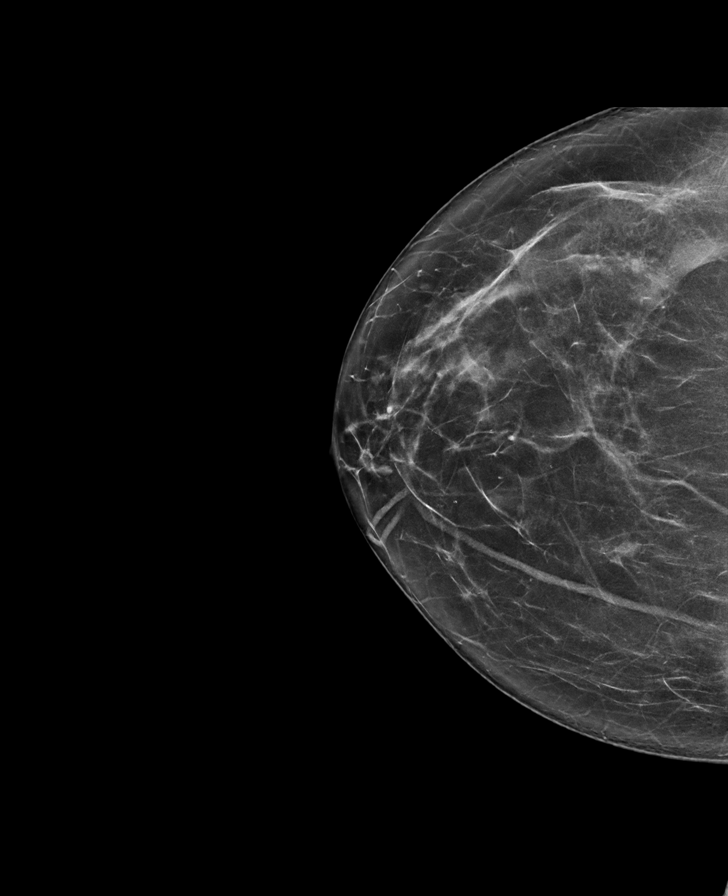

[L CC synth-2D]
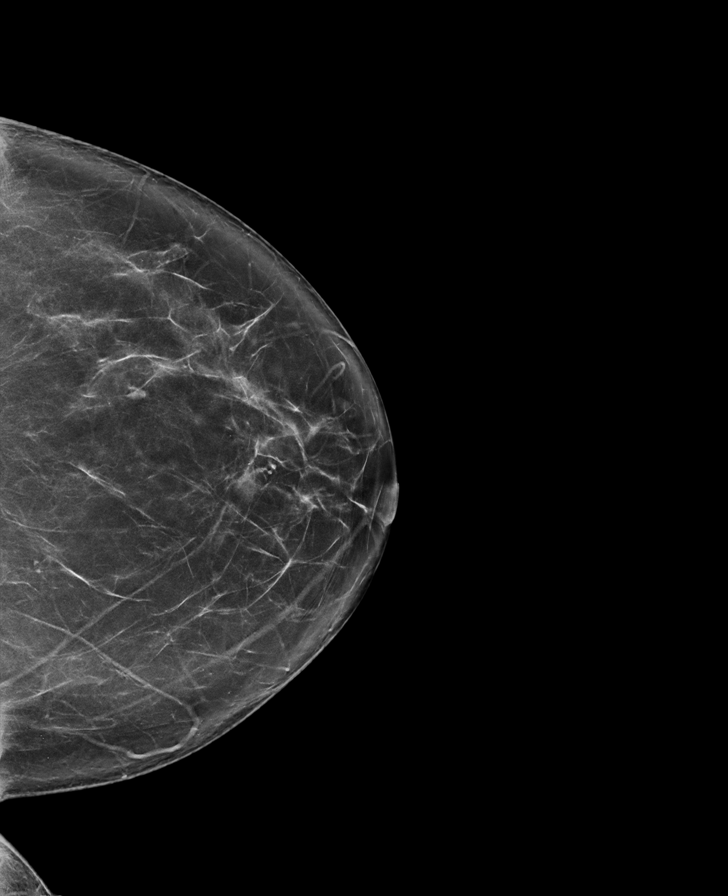

[L MLO synth-2D]
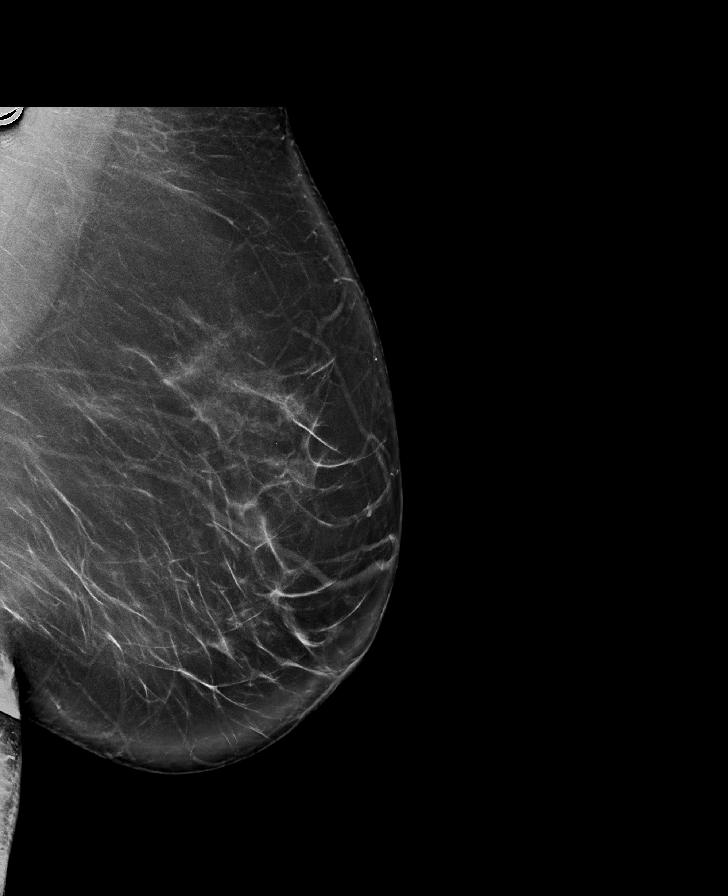

[R MLO synth-2D]
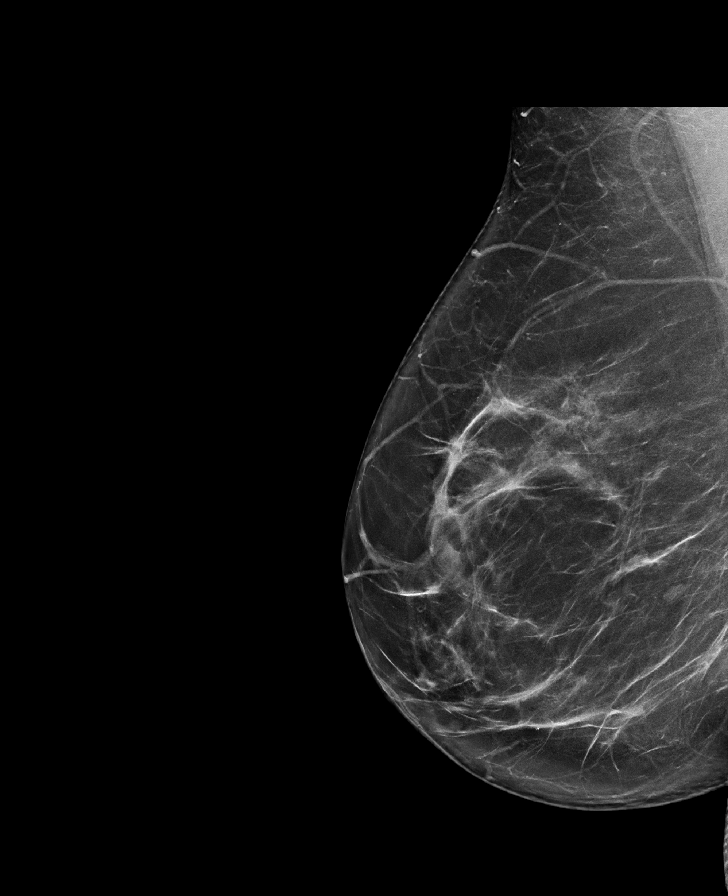

[L MLO tomo · tomo slice 49/97.0]
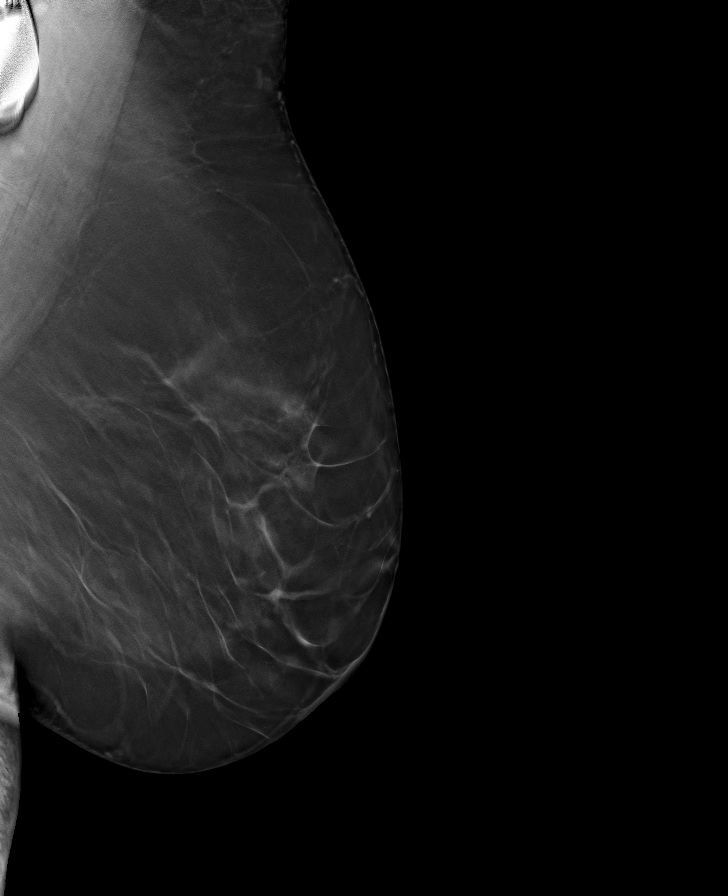

[R MLO tomo · tomo slice 44/87.0]
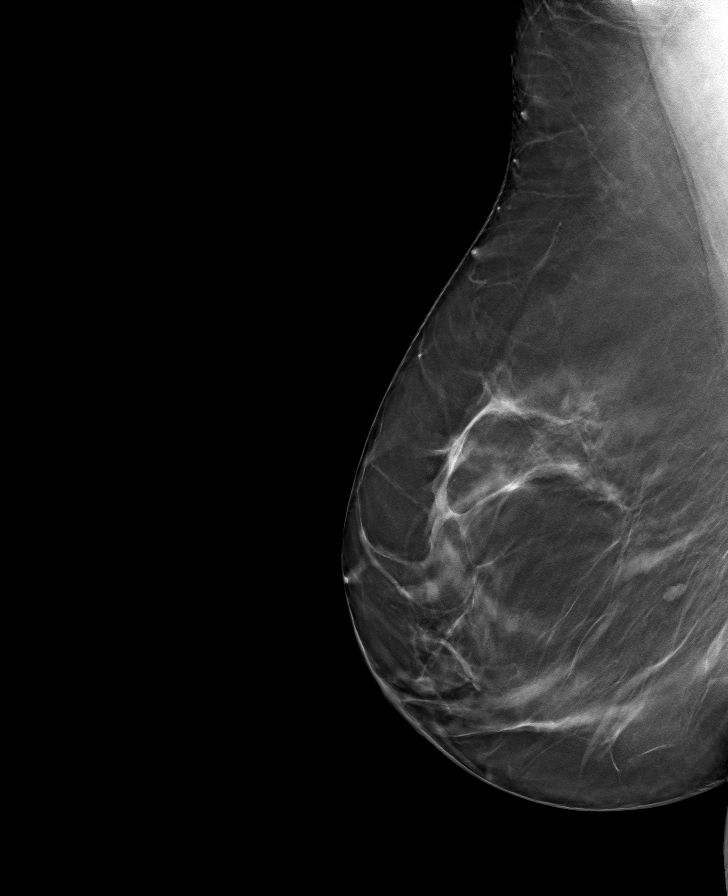

[R CC tomo · tomo slice 39/78.0]
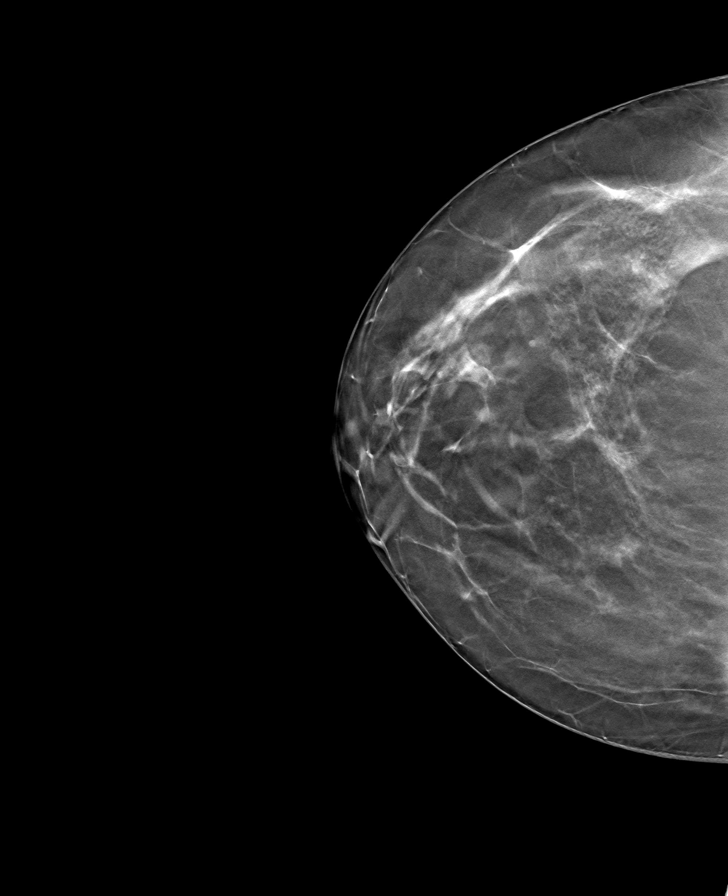

[L CC tomo · tomo slice 41/81.0]
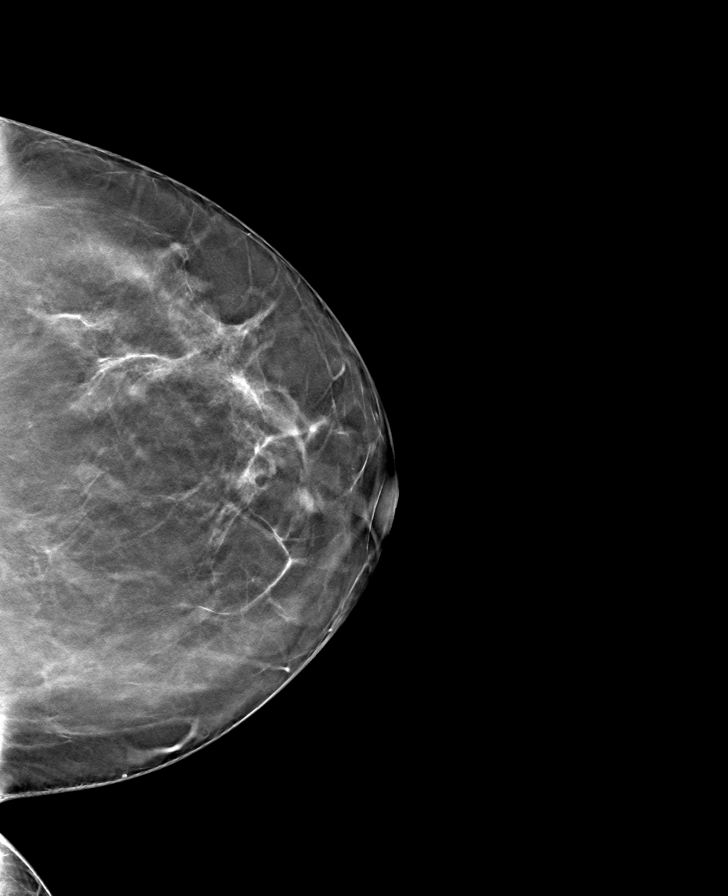

[8 of 24 positions shown; findings below may reference images not displayed]

ACR Breast Density Category b: There are scattered areas of
fibroglandular density.
FINDINGS: In the left breast, a possible mass warrants further evaluation. In
the right breast, no findings suspicious for malignancy.

Images were processed with CAD.
IMPRESSION: Further evaluation is suggested for possible mass in the left
breast.

RECOMMENDATION:
Diagnostic mammogram and possibly ultrasound of the left breast.
(Code:JC-2-SSL)

The patient will be contacted regarding the findings, and additional
imaging will be scheduled.

BI-RADS CATEGORY  0: Incomplete. Need additional imaging evaluation
and/or prior mammograms for comparison.

## 2020-10-10 DIAGNOSIS — M722 Plantar fascial fibromatosis: Secondary | ICD-10-CM | POA: Diagnosis not present

## 2020-10-10 DIAGNOSIS — M79673 Pain in unspecified foot: Secondary | ICD-10-CM | POA: Diagnosis not present

## 2020-10-12 ENCOUNTER — Other Ambulatory Visit: Payer: BC Managed Care – PPO | Admitting: Rheumatology

## 2020-10-27 ENCOUNTER — Ambulatory Visit (INDEPENDENT_AMBULATORY_CARE_PROVIDER_SITE_OTHER): Payer: BC Managed Care – PPO

## 2020-10-27 DIAGNOSIS — I471 Supraventricular tachycardia: Secondary | ICD-10-CM | POA: Diagnosis not present

## 2020-10-27 LAB — CUP PACEART REMOTE DEVICE CHECK
Battery Remaining Percentage: 68 %
Brady Statistic RA Percent Paced: 36 %
Brady Statistic RV Percent Paced: 0 %
Date Time Interrogation Session: 20220407093350
Implantable Lead Implant Date: 20180716
Implantable Lead Implant Date: 20180716
Implantable Lead Location: 753859
Implantable Lead Location: 753860
Implantable Lead Model: 5076
Implantable Lead Model: 5076
Implantable Pulse Generator Implant Date: 20180716
Lead Channel Impedance Value: 371 Ohm
Lead Channel Impedance Value: 546 Ohm
Lead Channel Pacing Threshold Amplitude: 0.5 V
Lead Channel Pacing Threshold Amplitude: 0.6 V
Lead Channel Pacing Threshold Pulse Width: 0.4 ms
Lead Channel Pacing Threshold Pulse Width: 0.4 ms
Lead Channel Sensing Intrinsic Amplitude: 13.5 mV
Lead Channel Sensing Intrinsic Amplitude: 3 mV
Lead Channel Setting Pacing Amplitude: 2 V
Lead Channel Setting Pacing Amplitude: 2.4 V
Lead Channel Setting Pacing Pulse Width: 0.4 ms
Pulse Gen Model: 407145
Pulse Gen Serial Number: 69161362

## 2020-11-10 NOTE — Progress Notes (Signed)
Remote pacemaker transmission.   

## 2020-12-11 DIAGNOSIS — M25572 Pain in left ankle and joints of left foot: Secondary | ICD-10-CM | POA: Diagnosis not present

## 2020-12-11 DIAGNOSIS — M25571 Pain in right ankle and joints of right foot: Secondary | ICD-10-CM | POA: Diagnosis not present

## 2020-12-11 DIAGNOSIS — R262 Difficulty in walking, not elsewhere classified: Secondary | ICD-10-CM | POA: Diagnosis not present

## 2020-12-11 DIAGNOSIS — R600 Localized edema: Secondary | ICD-10-CM | POA: Diagnosis not present

## 2020-12-13 DIAGNOSIS — R600 Localized edema: Secondary | ICD-10-CM | POA: Diagnosis not present

## 2020-12-13 DIAGNOSIS — R262 Difficulty in walking, not elsewhere classified: Secondary | ICD-10-CM | POA: Diagnosis not present

## 2020-12-13 DIAGNOSIS — M25572 Pain in left ankle and joints of left foot: Secondary | ICD-10-CM | POA: Diagnosis not present

## 2020-12-13 DIAGNOSIS — M25571 Pain in right ankle and joints of right foot: Secondary | ICD-10-CM | POA: Diagnosis not present

## 2020-12-20 DIAGNOSIS — M25571 Pain in right ankle and joints of right foot: Secondary | ICD-10-CM | POA: Diagnosis not present

## 2020-12-20 DIAGNOSIS — M25572 Pain in left ankle and joints of left foot: Secondary | ICD-10-CM | POA: Diagnosis not present

## 2020-12-20 DIAGNOSIS — R262 Difficulty in walking, not elsewhere classified: Secondary | ICD-10-CM | POA: Diagnosis not present

## 2020-12-20 DIAGNOSIS — R600 Localized edema: Secondary | ICD-10-CM | POA: Diagnosis not present

## 2020-12-22 DIAGNOSIS — R262 Difficulty in walking, not elsewhere classified: Secondary | ICD-10-CM | POA: Diagnosis not present

## 2020-12-22 DIAGNOSIS — M25572 Pain in left ankle and joints of left foot: Secondary | ICD-10-CM | POA: Diagnosis not present

## 2020-12-22 DIAGNOSIS — M25571 Pain in right ankle and joints of right foot: Secondary | ICD-10-CM | POA: Diagnosis not present

## 2020-12-22 DIAGNOSIS — R600 Localized edema: Secondary | ICD-10-CM | POA: Diagnosis not present

## 2021-01-01 ENCOUNTER — Other Ambulatory Visit: Payer: Self-pay | Admitting: Obstetrics & Gynecology

## 2021-01-01 DIAGNOSIS — M25571 Pain in right ankle and joints of right foot: Secondary | ICD-10-CM | POA: Diagnosis not present

## 2021-01-01 DIAGNOSIS — R600 Localized edema: Secondary | ICD-10-CM | POA: Diagnosis not present

## 2021-01-01 DIAGNOSIS — M25572 Pain in left ankle and joints of left foot: Secondary | ICD-10-CM | POA: Diagnosis not present

## 2021-01-01 DIAGNOSIS — N6002 Solitary cyst of left breast: Secondary | ICD-10-CM

## 2021-01-01 DIAGNOSIS — R262 Difficulty in walking, not elsewhere classified: Secondary | ICD-10-CM | POA: Diagnosis not present

## 2021-01-02 DIAGNOSIS — M79673 Pain in unspecified foot: Secondary | ICD-10-CM | POA: Diagnosis not present

## 2021-01-02 DIAGNOSIS — M722 Plantar fascial fibromatosis: Secondary | ICD-10-CM | POA: Diagnosis not present

## 2021-01-26 ENCOUNTER — Ambulatory Visit (INDEPENDENT_AMBULATORY_CARE_PROVIDER_SITE_OTHER): Payer: BC Managed Care – PPO

## 2021-01-26 DIAGNOSIS — I471 Supraventricular tachycardia: Secondary | ICD-10-CM

## 2021-01-28 LAB — CUP PACEART REMOTE DEVICE CHECK
Battery Remaining Percentage: 65 %
Brady Statistic AP VP Percent: 0 %
Brady Statistic AP VS Percent: 34 %
Brady Statistic AS VP Percent: 0 %
Brady Statistic AS VS Percent: 66 %
Brady Statistic RA Percent Paced: 34 %
Brady Statistic RV Percent Paced: 0 %
Date Time Interrogation Session: 20220707013100
Implantable Lead Implant Date: 20180716
Implantable Lead Implant Date: 20180716
Implantable Lead Location: 753859
Implantable Lead Location: 753860
Implantable Lead Model: 5076
Implantable Lead Model: 5076
Implantable Pulse Generator Implant Date: 20180716
Lead Channel Impedance Value: 366 Ohm
Lead Channel Impedance Value: 513 Ohm
Lead Channel Pacing Threshold Amplitude: 0.6 V
Lead Channel Pacing Threshold Amplitude: 0.7 V
Lead Channel Pacing Threshold Pulse Width: 0.4 ms
Lead Channel Pacing Threshold Pulse Width: 0.4 ms
Lead Channel Sensing Intrinsic Amplitude: 12.8 mV
Lead Channel Sensing Intrinsic Amplitude: 3 mV
Lead Channel Setting Pacing Amplitude: 2 V
Lead Channel Setting Pacing Amplitude: 2.4 V
Lead Channel Setting Pacing Pulse Width: 0.4 ms
Pulse Gen Model: 407145
Pulse Gen Serial Number: 69161362

## 2021-02-14 ENCOUNTER — Ambulatory Visit
Admission: RE | Admit: 2021-02-14 | Discharge: 2021-02-14 | Disposition: A | Discharge status: Home / Self Care | Payer: BC Managed Care – PPO | Source: Ambulatory Visit | Attending: Obstetrics & Gynecology | Admitting: Obstetrics & Gynecology

## 2021-02-14 ENCOUNTER — Other Ambulatory Visit: Payer: Self-pay

## 2021-02-14 DIAGNOSIS — N6002 Solitary cyst of left breast: Secondary | ICD-10-CM

## 2021-02-14 DIAGNOSIS — R922 Inconclusive mammogram: Secondary | ICD-10-CM | POA: Diagnosis not present

## 2021-02-19 NOTE — Progress Notes (Signed)
Remote pacemaker transmission.   

## 2021-04-25 LAB — CUP PACEART REMOTE DEVICE CHECK
Battery Remaining Percentage: 60 %
Brady Statistic RA Percent Paced: 37 %
Brady Statistic RV Percent Paced: 0 %
Date Time Interrogation Session: 20221005092343
Implantable Lead Implant Date: 20180716
Implantable Lead Implant Date: 20180716
Implantable Lead Location: 753859
Implantable Lead Location: 753860
Implantable Lead Model: 5076
Implantable Lead Model: 5076
Implantable Pulse Generator Implant Date: 20180716
Lead Channel Impedance Value: 371 Ohm
Lead Channel Impedance Value: 546 Ohm
Lead Channel Pacing Threshold Amplitude: 0.5 V
Lead Channel Pacing Threshold Amplitude: 0.6 V
Lead Channel Pacing Threshold Pulse Width: 0.4 ms
Lead Channel Pacing Threshold Pulse Width: 0.4 ms
Lead Channel Sensing Intrinsic Amplitude: 12.7 mV
Lead Channel Sensing Intrinsic Amplitude: 3 mV
Lead Channel Setting Pacing Amplitude: 2 V
Lead Channel Setting Pacing Amplitude: 2.4 V
Lead Channel Setting Pacing Pulse Width: 0.4 ms
Pulse Gen Model: 407145
Pulse Gen Serial Number: 69161362

## 2021-04-27 ENCOUNTER — Ambulatory Visit (INDEPENDENT_AMBULATORY_CARE_PROVIDER_SITE_OTHER): Payer: BC Managed Care – PPO

## 2021-04-27 DIAGNOSIS — Z950 Presence of cardiac pacemaker: Secondary | ICD-10-CM

## 2021-05-03 ENCOUNTER — Encounter (HOSPITAL_BASED_OUTPATIENT_CLINIC_OR_DEPARTMENT_OTHER): Payer: Self-pay | Admitting: Obstetrics & Gynecology

## 2021-05-03 ENCOUNTER — Other Ambulatory Visit: Payer: Self-pay

## 2021-05-03 ENCOUNTER — Ambulatory Visit (INDEPENDENT_AMBULATORY_CARE_PROVIDER_SITE_OTHER): Payer: BC Managed Care – PPO | Admitting: Obstetrics & Gynecology

## 2021-05-03 VITALS — BP 125/78 | HR 103 | Ht 62.5 in | Wt 219.0 lb

## 2021-05-03 DIAGNOSIS — Z01419 Encounter for gynecological examination (general) (routine) without abnormal findings: Secondary | ICD-10-CM

## 2021-05-03 DIAGNOSIS — D229 Melanocytic nevi, unspecified: Secondary | ICD-10-CM | POA: Diagnosis not present

## 2021-05-03 DIAGNOSIS — Z90710 Acquired absence of both cervix and uterus: Secondary | ICD-10-CM

## 2021-05-03 DIAGNOSIS — Z800 Family history of malignant neoplasm of digestive organs: Secondary | ICD-10-CM | POA: Diagnosis not present

## 2021-05-03 DIAGNOSIS — Z23 Encounter for immunization: Secondary | ICD-10-CM

## 2021-05-03 NOTE — Progress Notes (Signed)
50 y.o. G40P2012 Married White or Caucasian female here for annual exam.  She's changed jobs this year and that has been a good thing.  Her pay and vacation were matched.    Had a lot of joint pain after her Covid vaccination.  She ended up seeing a rheumatologist.  Evaluation was basically negative.    Olivia Mackie, daughter, is getting married next May.    Patient's last menstrual period was 08/20/2011 (exact date).          Sexually active: Yes.    The current method of family planning is status post hysterectomy.    Exercising: Yes.     walking Smoker:  no  Health Maintenance: Pap:  not indicated History of abnormal Pap:  no MMG:  02/14/21 benign Colonoscopy:  2013- Dr. Collene Mares TDaP:  06/02/14 Screening Labs: declines   reports that she has never smoked. She has never used smokeless tobacco. She reports current alcohol use. She reports that she does not use drugs.  Past Medical History:  Diagnosis Date   Abnormal uterine bleeding    Complicated migraine 9/92/4268   Heart murmur    Migraine with aura 02/12/2013   Presence of permanent cardiac pacemaker    Stress    saw neurologist 6/15 for numbess in face and arm   Ventricular tachycardia seen on cardiac monitor     Past Surgical History:  Procedure Laterality Date   [place]; [place]  09/10/2011   Procedure: CYSTOSCOPY;  Surgeon: Felipa Emory, MD;  Location: [facility];  Service: Gynecology;  Laterality: N/A;   DILATION AND CURETTAGE OF UTERUS     ENDOMETRIAL ABLATION     failed - done in MD office   EP IMPLANTABLE DEVICE N/A 02/07/2016   Procedure: Loop Recorder Insertion;  Surgeon: Deboraha Sprang, MD;  Location: [facility];  Service: Cardiovascular;  Laterality: N/A;   INSERT / REPLACE / REMOVE PACEMAKER  02/03/2017   LAPAROSCOPIC TOTAL HYSTERECTOMY     robotic   LOOP RECORDER REMOVAL  02/03/2017   LOOP RECORDER REMOVAL N/A 02/03/2017   Procedure: Loop Recorder Removal;  Surgeon: Deboraha Sprang,  MD;  Location: [facility];  Service: Cardiovascular;  Laterality: N/A;   PACEMAKER IMPLANT N/A 02/03/2017   Procedure: Pacemaker Implant;  Surgeon: Deboraha Sprang, MD;  Location: [facility];  Service: Cardiovascular;  Laterality: N/A;   TUBAL LIGATION  1997   WISDOM TOOTH EXTRACTION      Current Outpatient Medications  Medication Sig Dispense Refill   Calcium Carbonate-Vit D-Min (CALCIUM 1200 PO) Take by mouth daily.     escitalopram (LEXAPRO) 10 MG tablet Take 1/2 tab daily for 4 days and then increase to 1 tab daily. 30 tablet 2   fluticasone (CUTIVATE) 0.05 % cream as needed.     metoprolol succinate (TOPROL-XL) 25 MG 24 hr tablet Take 1 tablet (25 mg total) by mouth daily. Take with or immediately following a meal. 90 tablet 3   Probiotic Product (PROBIOTIC DAILY PO) Take 1 capsule by mouth daily.     triamcinolone cream (KENALOG) 0.5 % Apply 1 application topically 3 (three) times daily. (Patient taking differently: Apply 1 application topically as needed.) 30 g 0   No current facility-administered medications for this visit.    Family History  Problem Relation Age of Onset   Diabetes Maternal Grandfather    Lung cancer Maternal Grandfather    Diabetes Paternal Grandmother  Diabetes Paternal Grandfather    Kidney Stones Father    Gallbladder disease Mother    Hypertension Mother    Colon cancer Maternal Aunt 30       second occurence age 55   Colon cancer Cousin 69       paternal cousin   Breast cancer Paternal 29    Healthy Daughter    Healthy Daughter     Review of Systems  All other systems reviewed and are negative.  Exam:   BP 125/78   Pulse (!) 103   Ht 5' 2.5" (1.588 m)   Wt 219 lb (99.3 kg)   LMP 08/20/2011 (Exact Date)   BMI 39.42 kg/m   Height: 5' 2.5" (158.8 cm)  General appearance: alert, cooperative and appears stated age Head: Normocephalic, without obvious abnormality, atraumatic Neck: no adenopathy, supple, symmetrical,  trachea midline and thyroid normal to inspection and palpation Lungs: clear to auscultation bilaterally Breasts: normal appearance, no masses or tenderness Heart: regular rate and rhythm Abdomen: soft, non-tender; bowel sounds normal; no masses,  no organomegaly Extremities: extremities normal, atraumatic, no cyanosis or edema Skin: Skin color, texture, turgor normal. No rashes or lesions Lymph nodes: Cervical, supraclavicular, and axillary nodes normal. No abnormal inguinal nodes palpated Neurologic: Grossly normal   Pelvic: External genitalia:  no lesions              Urethra:  normal appearing urethra with no masses, tenderness or lesions              Bartholins and Skenes: normal                 Vagina: normal appearing vagina with normal color and no discharge, no lesions              Cervix: absent              Pap taken: No. Bimanual Exam:  Uterus:  uterus absent              Adnexa: no mass, fullness, tenderness               Rectovaginal: Confirms               Anus:  normal sphincter tone, no lesions  Chaperone, Ezekiel Ina, RN, was present for exam.  Assessment/Plan:  1. Well woman exam with routine gynecological exam - pap smear not indicated - MMG up to date - release for colonoscopy scheduled.  Plan colonoscopy in 06/2022 as this will be 10 years from prior one - Care Gaps reviewed - flu shot given  2. Family history of colon cancer  3. H/O: hysterectomy - 2013  4. Atypical mole removed from vulva - normal exam today

## 2021-05-08 ENCOUNTER — Encounter (HOSPITAL_BASED_OUTPATIENT_CLINIC_OR_DEPARTMENT_OTHER): Payer: Self-pay | Admitting: *Deleted

## 2021-05-08 NOTE — Progress Notes (Signed)
Remote pacemaker transmission.   

## 2021-06-01 DIAGNOSIS — R5383 Other fatigue: Secondary | ICD-10-CM | POA: Diagnosis not present

## 2021-06-01 DIAGNOSIS — Z131 Encounter for screening for diabetes mellitus: Secondary | ICD-10-CM | POA: Diagnosis not present

## 2021-06-01 DIAGNOSIS — Z0000 Encounter for general adult medical examination without abnormal findings: Secondary | ICD-10-CM | POA: Diagnosis not present

## 2021-06-01 DIAGNOSIS — Z1329 Encounter for screening for other suspected endocrine disorder: Secondary | ICD-10-CM | POA: Diagnosis not present

## 2021-06-01 DIAGNOSIS — Z13220 Encounter for screening for lipoid disorders: Secondary | ICD-10-CM | POA: Diagnosis not present

## 2021-07-25 LAB — CUP PACEART REMOTE DEVICE CHECK
Battery Remaining Percentage: 60 %
Brady Statistic RA Percent Paced: 36 %
Brady Statistic RV Percent Paced: 0 %
Date Time Interrogation Session: 20230104081757
Implantable Lead Implant Date: 20180716
Implantable Lead Implant Date: 20180716
Implantable Lead Location: 753859
Implantable Lead Location: 753860
Implantable Lead Model: 5076
Implantable Lead Model: 5076
Implantable Pulse Generator Implant Date: 20180716
Lead Channel Impedance Value: 371 Ohm
Lead Channel Impedance Value: 468 Ohm
Lead Channel Pacing Threshold Amplitude: 0.6 V
Lead Channel Pacing Threshold Pulse Width: 0.4 ms
Lead Channel Sensing Intrinsic Amplitude: 13.2 mV
Lead Channel Sensing Intrinsic Amplitude: 3.2 mV
Lead Channel Setting Pacing Amplitude: 2 V
Lead Channel Setting Pacing Amplitude: 2.4 V
Lead Channel Setting Pacing Pulse Width: 0.4 ms
Pulse Gen Model: 407145
Pulse Gen Serial Number: 69161362

## 2021-07-27 ENCOUNTER — Ambulatory Visit (INDEPENDENT_AMBULATORY_CARE_PROVIDER_SITE_OTHER): Payer: BC Managed Care – PPO

## 2021-07-27 DIAGNOSIS — R55 Syncope and collapse: Secondary | ICD-10-CM | POA: Diagnosis not present

## 2021-08-07 NOTE — Progress Notes (Signed)
Remote pacemaker transmission.   

## 2021-10-25 LAB — CUP PACEART REMOTE DEVICE CHECK
Battery Remaining Percentage: 60 %
Brady Statistic RA Percent Paced: 36 %
Brady Statistic RV Percent Paced: 0 %
Date Time Interrogation Session: 20230405093150
Implantable Lead Implant Date: 20180716
Implantable Lead Implant Date: 20180716
Implantable Lead Location: 753859
Implantable Lead Location: 753860
Implantable Lead Model: 5076
Implantable Lead Model: 5076
Implantable Pulse Generator Implant Date: 20180716
Lead Channel Impedance Value: 371 Ohm
Lead Channel Impedance Value: 468 Ohm
Lead Channel Pacing Threshold Amplitude: 0.6 V
Lead Channel Pacing Threshold Amplitude: 0.6 V
Lead Channel Pacing Threshold Pulse Width: 0.4 ms
Lead Channel Pacing Threshold Pulse Width: 0.4 ms
Lead Channel Sensing Intrinsic Amplitude: 12.5 mV
Lead Channel Sensing Intrinsic Amplitude: 3.1 mV
Lead Channel Setting Pacing Amplitude: 2 V
Lead Channel Setting Pacing Amplitude: 2.4 V
Lead Channel Setting Pacing Pulse Width: 0.4 ms
Pulse Gen Model: 407145
Pulse Gen Serial Number: 69161362

## 2021-10-26 ENCOUNTER — Ambulatory Visit (INDEPENDENT_AMBULATORY_CARE_PROVIDER_SITE_OTHER): Payer: BC Managed Care – PPO

## 2021-10-26 DIAGNOSIS — R55 Syncope and collapse: Secondary | ICD-10-CM

## 2021-11-13 NOTE — Progress Notes (Signed)
Remote pacemaker transmission.   

## 2021-11-22 DIAGNOSIS — L298 Other pruritus: Secondary | ICD-10-CM | POA: Diagnosis not present

## 2021-11-22 DIAGNOSIS — D2372 Other benign neoplasm of skin of left lower limb, including hip: Secondary | ICD-10-CM | POA: Diagnosis not present

## 2021-11-22 DIAGNOSIS — L820 Inflamed seborrheic keratosis: Secondary | ICD-10-CM | POA: Diagnosis not present

## 2021-11-22 DIAGNOSIS — D225 Melanocytic nevi of trunk: Secondary | ICD-10-CM | POA: Diagnosis not present

## 2021-11-22 DIAGNOSIS — L821 Other seborrheic keratosis: Secondary | ICD-10-CM | POA: Diagnosis not present

## 2021-11-22 DIAGNOSIS — Z08 Encounter for follow-up examination after completed treatment for malignant neoplasm: Secondary | ICD-10-CM | POA: Diagnosis not present

## 2021-11-22 DIAGNOSIS — L814 Other melanin hyperpigmentation: Secondary | ICD-10-CM | POA: Diagnosis not present

## 2021-11-22 DIAGNOSIS — D485 Neoplasm of uncertain behavior of skin: Secondary | ICD-10-CM | POA: Diagnosis not present

## 2022-01-01 ENCOUNTER — Encounter (HOSPITAL_BASED_OUTPATIENT_CLINIC_OR_DEPARTMENT_OTHER): Payer: Self-pay | Admitting: Obstetrics & Gynecology

## 2022-01-25 ENCOUNTER — Ambulatory Visit (INDEPENDENT_AMBULATORY_CARE_PROVIDER_SITE_OTHER): Payer: BC Managed Care – PPO

## 2022-01-25 DIAGNOSIS — R55 Syncope and collapse: Secondary | ICD-10-CM

## 2022-01-26 LAB — CUP PACEART REMOTE DEVICE CHECK
Battery Remaining Percentage: 55 %
Brady Statistic AP VP Percent: 0 %
Brady Statistic AP VS Percent: 31 %
Brady Statistic AS VP Percent: 0 %
Brady Statistic AS VS Percent: 69 %
Brady Statistic RA Percent Paced: 36 %
Brady Statistic RV Percent Paced: 0 %
Date Time Interrogation Session: 20230705230700
Implantable Lead Implant Date: 20180716
Implantable Lead Implant Date: 20180716
Implantable Lead Location: 753859
Implantable Lead Location: 753860
Implantable Lead Model: 5076
Implantable Lead Model: 5076
Implantable Pulse Generator Implant Date: 20180716
Lead Channel Impedance Value: 371 Ohm
Lead Channel Impedance Value: 468 Ohm
Lead Channel Pacing Threshold Amplitude: 0.6 V
Lead Channel Pacing Threshold Amplitude: 0.6 V
Lead Channel Pacing Threshold Pulse Width: 0.4 ms
Lead Channel Pacing Threshold Pulse Width: 0.4 ms
Lead Channel Sensing Intrinsic Amplitude: 13.2 mV
Lead Channel Sensing Intrinsic Amplitude: 3.2 mV
Lead Channel Setting Pacing Amplitude: 2 V
Lead Channel Setting Pacing Amplitude: 2.4 V
Lead Channel Setting Pacing Pulse Width: 0.4 ms
Pulse Gen Model: 407145
Pulse Gen Serial Number: 69161362

## 2022-02-11 NOTE — Progress Notes (Signed)
Remote pacemaker transmission.   

## 2022-02-15 ENCOUNTER — Ambulatory Visit (HOSPITAL_BASED_OUTPATIENT_CLINIC_OR_DEPARTMENT_OTHER)
Admission: RE | Admit: 2022-02-15 | Discharge: 2022-02-15 | Disposition: A | Discharge status: Home / Self Care | Payer: BC Managed Care – PPO | Source: Ambulatory Visit | Attending: Diagnostic Radiology | Admitting: Diagnostic Radiology

## 2022-02-15 DIAGNOSIS — Z1231 Encounter for screening mammogram for malignant neoplasm of breast: Secondary | ICD-10-CM | POA: Diagnosis not present

## 2022-04-26 ENCOUNTER — Ambulatory Visit (INDEPENDENT_AMBULATORY_CARE_PROVIDER_SITE_OTHER): Payer: BC Managed Care – PPO

## 2022-04-26 DIAGNOSIS — I471 Supraventricular tachycardia, unspecified: Secondary | ICD-10-CM

## 2022-04-29 ENCOUNTER — Encounter (HOSPITAL_BASED_OUTPATIENT_CLINIC_OR_DEPARTMENT_OTHER): Payer: Self-pay | Admitting: Obstetrics & Gynecology

## 2022-04-30 LAB — CUP PACEART REMOTE DEVICE CHECK
Date Time Interrogation Session: 20231009145145
Implantable Lead Implant Date: 20180716
Implantable Lead Implant Date: 20180716
Implantable Lead Location: 753859
Implantable Lead Location: 753860
Implantable Lead Model: 5076
Implantable Lead Model: 5076
Implantable Pulse Generator Implant Date: 20180716
Pulse Gen Model: 407145
Pulse Gen Serial Number: 69161362

## 2022-04-30 NOTE — Progress Notes (Signed)
Remote pacemaker transmission.   

## 2022-05-10 DIAGNOSIS — Z0000 Encounter for general adult medical examination without abnormal findings: Secondary | ICD-10-CM | POA: Diagnosis not present

## 2022-05-10 DIAGNOSIS — R5383 Other fatigue: Secondary | ICD-10-CM | POA: Diagnosis not present

## 2022-05-10 DIAGNOSIS — R232 Flushing: Secondary | ICD-10-CM | POA: Diagnosis not present

## 2022-05-10 DIAGNOSIS — E7800 Pure hypercholesterolemia, unspecified: Secondary | ICD-10-CM | POA: Diagnosis not present

## 2022-05-10 DIAGNOSIS — Z13220 Encounter for screening for lipoid disorders: Secondary | ICD-10-CM | POA: Diagnosis not present

## 2022-05-10 DIAGNOSIS — Z23 Encounter for immunization: Secondary | ICD-10-CM | POA: Diagnosis not present

## 2022-05-10 DIAGNOSIS — Z131 Encounter for screening for diabetes mellitus: Secondary | ICD-10-CM | POA: Diagnosis not present

## 2022-05-10 DIAGNOSIS — Z1329 Encounter for screening for other suspected endocrine disorder: Secondary | ICD-10-CM | POA: Diagnosis not present

## 2022-05-14 DIAGNOSIS — Z950 Presence of cardiac pacemaker: Secondary | ICD-10-CM | POA: Insufficient documentation

## 2022-05-15 ENCOUNTER — Ambulatory Visit: Payer: BC Managed Care – PPO | Attending: Internal Medicine | Admitting: Internal Medicine

## 2022-05-15 ENCOUNTER — Encounter: Payer: Self-pay | Admitting: Internal Medicine

## 2022-05-15 VITALS — BP 112/74 | HR 82 | Ht 62.5 in | Wt 189.8 lb

## 2022-05-15 DIAGNOSIS — Z950 Presence of cardiac pacemaker: Secondary | ICD-10-CM

## 2022-05-15 DIAGNOSIS — R55 Syncope and collapse: Secondary | ICD-10-CM

## 2022-05-15 NOTE — Patient Instructions (Signed)
Medication Instructions:  Your physician recommends that you continue on your current medications as directed. Please refer to the Current Medication list given to you today.  *If you need a refill on your cardiac medications before your next appointment, please call your pharmacy*   Lab Work: None ordered.  If you have labs (blood work) drawn today and your tests are completely normal, you will receive your results only by: MyChart Message (if you have MyChart) OR A paper copy in the mail If you have any lab test that is abnormal or we need to change your treatment, we will call you to review the results.   Testing/Procedures: None ordered.    Follow-Up: At [facility], you and your health needs are our priority.  As part of our continuing mission to provide you with exceptional heart care, we have created designated Provider Care Teams.  These Care Teams include your primary Cardiologist (physician) and Advanced Practice Providers (APPs -  Physician Assistants and Nurse Practitioners) who all work together to provide you with the care you need, when you need it.  We recommend signing up for the patient portal called "MyChart".  Sign up information is provided on this After Visit Summary.  MyChart is used to connect with patients for Virtual Visits (Telemedicine).  Patients are able to view lab/test results, encounter notes, upcoming appointments, etc.  Non-urgent messages can be sent to your provider as well.   To learn more about what you can do with MyChart, go to [email or web address].    Your next appointment:   12 months with Dr Klein  Important Information About Sugar       

## 2022-05-15 NOTE — Progress Notes (Signed)
Patient Care Team: Teressa Senter, FNP as PCP - General (Family Medicine)   HPI  Erika [name] is a 51 y.o. 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  More recently she was having problems with sinus tachycardia mostly at work.  She was given beta-blockers   She underwent pacing with a Biotronik CLS device 9/18 for recurrent syncope.  No interval syncope  The patient denies chest pain, shortness of breath, nocturnal dyspnea, orthopnea or peripheral edema.  There have been no palpitations, lightheadedness or syncope.   Has lost 30 pounds.  Feels terrific.     No chest pain. Past Medical History:  Diagnosis Date   Abnormal uterine bleeding    Complicated migraine 6/38/7564   Heart murmur    Migraine with aura 02/12/2013   Presence of permanent cardiac pacemaker    Stress    saw neurologist 6/15 for numbess in face and arm   Ventricular tachycardia seen on cardiac monitor [facility])     Past Surgical History:  Procedure Laterality Date   [place]; 1997   CYSTOSCOPY  09/10/2011   Procedure: CYSTOSCOPY;  Surgeon: Felipa Emory, MD;  Location: [place];  Service: Gynecology;  Laterality: N/A;   DILATION AND CURETTAGE OF UTERUS     ENDOMETRIAL ABLATION     failed - done in MD office   EP IMPLANTABLE DEVICE N/A 02/07/2016   Procedure: Loop Recorder Insertion;  Surgeon: Deboraha Sprang, MD;  Location: [place] [facility];  Service: Cardiovascular;  Laterality: N/A;   INSERT / REPLACE / REMOVE PACEMAKER  02/03/2017   LAPAROSCOPIC TOTAL HYSTERECTOMY     robotic   LOOP RECORDER REMOVAL  02/03/2017   LOOP RECORDER REMOVAL N/A 02/03/2017   Procedure: Loop Recorder Removal;  Surgeon: Deboraha Sprang, MD;  Location: [place] [facility];  Service: Cardiovascular;  Laterality: N/A;   PACEMAKER IMPLANT N/A 02/03/2017   Procedure: Pacemaker Implant;  Surgeon: Deboraha Sprang, MD;  Location: [place] [facility];  Service: Cardiovascular;  Laterality: N/A;    TUBAL LIGATION  1997   WISDOM TOOTH EXTRACTION      Current Outpatient Medications  Medication Sig Dispense Refill   Calcium Carbonate-Vit D-Min (CALCIUM 1200 PO) Take by mouth daily.     fluticasone (CUTIVATE) 0.05 % cream as needed.     Probiotic Product (PROBIOTIC DAILY PO) Take 1 capsule by mouth daily.     triamcinolone cream (KENALOG) 0.5 % Apply 1 application topically 3 (three) times daily. 30 g 0   metoprolol succinate (TOPROL-XL) 25 MG 24 hr tablet Take 1 tablet (25 mg total) by mouth daily. Take with or immediately following a meal. (Patient not taking: Reported on 05/15/2022) 90 tablet 3   No current facility-administered medications for this visit.    Allergies  Allergen Reactions   Penicillins Rash    Has patient had a PCN reaction causing immediate rash, facial/tongue/throat swelling, SOB or lightheadedness with hypotension: Yes Has patient had a PCN reaction causing severe rash involving mucus membranes or skin necrosis: No Has patient had a PCN reaction that required hospitalization No Has patient had a PCN reaction occurring within the last 10 years: Yes If all of the above answers are "NO", then may proceed with Cephalosporin use.  Has patient had a PCN reaction causing immediate rash, facial/tongue/throat swelling, SOB or lightheadedness with hypotension: Yes Has patient had a PCN reaction causing severe rash involving mucus membranes or skin necrosis: No  Has patient had a PCN reaction that required hospitalization No Has patient had a PCN reaction occurring within the last 10 years: Yes If all of the above answers are "NO", then may proceed with Cephalosporin use.   Zomig [Zolmitriptan] Rash      Review of Systems negative except from HPI and PMH  Physical Exam BP 112/74   Pulse 82   Ht 5' 2.5" (1.588 m)   Wt 189 lb 12.8 oz (86.1 kg)   LMP 08/20/2011 (Exact Date)   SpO2 98%   BMI 34.16 kg/m  Well developed and nourished in no acute distress HENT  normal Neck supple with JVP-flat Clear Regular rate and rhythm, no murmurs or gallops Abd-soft with active BS No Clubbing cyanosis edema Skin-warm and dry A & Oriented  Grossly normal sensory and motor function  Atrial pacing at 82 Interval 16/09/36  Assessment and  Plan  Syncope-Deglutition   Pacemaker -biotronik   SVT-long RP  Has lost 30 pounds.  Feels terrific.  No interval syncope.  Tachy palpitations.

## 2022-06-04 DIAGNOSIS — E669 Obesity, unspecified: Secondary | ICD-10-CM | POA: Diagnosis not present

## 2022-06-04 DIAGNOSIS — Z1211 Encounter for screening for malignant neoplasm of colon: Secondary | ICD-10-CM | POA: Diagnosis not present

## 2022-06-04 DIAGNOSIS — Z800 Family history of malignant neoplasm of digestive organs: Secondary | ICD-10-CM | POA: Diagnosis not present

## 2022-06-04 DIAGNOSIS — K5900 Constipation, unspecified: Secondary | ICD-10-CM | POA: Diagnosis not present

## 2022-06-10 ENCOUNTER — Encounter (HOSPITAL_BASED_OUTPATIENT_CLINIC_OR_DEPARTMENT_OTHER): Payer: Self-pay | Admitting: Obstetrics & Gynecology

## 2022-06-10 ENCOUNTER — Ambulatory Visit (INDEPENDENT_AMBULATORY_CARE_PROVIDER_SITE_OTHER): Payer: BC Managed Care – PPO | Admitting: Obstetrics & Gynecology

## 2022-06-10 VITALS — BP 122/77 | HR 90 | Ht 62.5 in | Wt 187.8 lb

## 2022-06-10 DIAGNOSIS — Z800 Family history of malignant neoplasm of digestive organs: Secondary | ICD-10-CM | POA: Insufficient documentation

## 2022-06-10 DIAGNOSIS — Z01419 Encounter for gynecological examination (general) (routine) without abnormal findings: Secondary | ICD-10-CM

## 2022-06-10 DIAGNOSIS — N951 Menopausal and female climacteric states: Secondary | ICD-10-CM | POA: Diagnosis not present

## 2022-06-10 DIAGNOSIS — Z90710 Acquired absence of both cervix and uterus: Secondary | ICD-10-CM

## 2022-06-10 DIAGNOSIS — G43109 Migraine with aura, not intractable, without status migrainosus: Secondary | ICD-10-CM | POA: Diagnosis not present

## 2022-06-10 MED ORDER — PAROXETINE HCL 10 MG PO TABS: 10.0000 mg | ORAL_TABLET | ORAL | 2 refills | Status: DC

## 2022-06-10 NOTE — Progress Notes (Addendum)
51 y.o. I4P8099 Married White or Caucasian female here for annual exam.   Doing well.  Denies vaginal bleeding.  Having mood swings and hot flashes.  Has gotten some better.  Options discussed.  Did have work done October with elevated FSH and low estradiol.  Feel she would benefit from treatment.  Patient's last menstrual period was 08/20/2011 (exact date).          Sexually active: Yes.    The current method of family planning is status post hysterectomy.    Exercising: Yes.     walking Smoker:  no  Health Maintenance: Pap:  not indicated History of abnormal Pap:  no MMG:  02/15/2022 Negative Colonoscopy:  06/29/2012, scheduled for 08/24/2021 BMD:   not indicated Screening Labs: reviewed in Care Everywhere   reports that she has never smoked. She has never used smokeless tobacco. She reports current alcohol use. She reports that she does not use drugs.  Past Medical History:  Diagnosis Date   Abnormal uterine bleeding    Complicated migraine 8/33/8250   Heart murmur    Migraine with aura 02/12/2013   Presence of permanent cardiac pacemaker    Stress    saw neurologist 6/15 for numbess in face and arm   Ventricular tachycardia seen on cardiac monitor [facility])     Past Surgical History:  Procedure Laterality Date   [place]; 1997   CYSTOSCOPY  09/10/2011   Procedure: CYSTOSCOPY;  Surgeon: Felipa Emory, MD;  Location: [facility];  Service: Gynecology;  Laterality: N/A;   DILATION AND CURETTAGE OF UTERUS     ENDOMETRIAL ABLATION     failed - done in MD office   EP IMPLANTABLE DEVICE N/A 02/07/2016   Procedure: Loop Recorder Insertion;  Surgeon: Deboraha Sprang, MD;  Location: [place] [facility];  Service: Cardiovascular;  Laterality: N/A;   INSERT / REPLACE / REMOVE PACEMAKER  02/03/2017   LAPAROSCOPIC TOTAL HYSTERECTOMY     robotic   LOOP RECORDER REMOVAL  02/03/2017   LOOP RECORDER REMOVAL N/A 02/03/2017   Procedure: Loop Recorder Removal;  Surgeon: Deboraha Sprang,  MD;  Location: [place] [facility];  Service: Cardiovascular;  Laterality: N/A;   PACEMAKER IMPLANT N/A 02/03/2017   Procedure: Pacemaker Implant;  Surgeon: Deboraha Sprang, MD;  Location: [place] [facility];  Service: Cardiovascular;  Laterality: N/A;   TUBAL LIGATION  1997   WISDOM TOOTH EXTRACTION      Current Outpatient Medications  Medication Sig Dispense Refill   Calcium Carbonate-Vit D-Min (CALCIUM 1200 PO) Take by mouth daily.     fluticasone (CUTIVATE) 0.05 % cream as needed.     PARoxetine (PAXIL) 10 MG tablet Take 1 tablet (10 mg total) by mouth every morning. 30 tablet 2   Probiotic Product (PROBIOTIC DAILY PO) Take 1 capsule by mouth daily.     triamcinolone cream (KENALOG) 0.5 % Apply 1 application topically 3 (three) times daily. 30 g 0   No current facility-administered medications for this visit.    Family History  Problem Relation Age of Onset   Diabetes Maternal Grandfather    Lung cancer Maternal Grandfather    Diabetes Paternal Grandmother    Diabetes Paternal Grandfather    Kidney Stones Father    Gallbladder disease Mother    Hypertension Mother    Colon cancer Maternal Aunt 61       second occurence age 25   Colon cancer Cousin 67  paternal cousin   Breast cancer Paternal 25    Healthy Daughter    Healthy Daughter     ROS: Constitutional: negative Genitourinary:negative  Exam:   BP 122/77 (BP Location: Right Arm, Patient Position: Sitting, Cuff Size: Large)   Pulse 90   Ht 5' 2.5" (1.588 m)   Wt 187 lb 12.8 oz (85.2 kg)   LMP 08/20/2011 (Exact Date)   BMI 33.80 kg/m   Height: 5' 2.5" (158.8 cm)  General appearance: alert, cooperative and appears stated age Head: Normocephalic, without obvious abnormality, atraumatic Neck: no adenopathy, supple, symmetrical, trachea midline and thyroid normal to inspection and palpation Lungs: clear to auscultation bilaterally Breasts: normal appearance, no masses or tenderness Heart: regular rate and  rhythm Abdomen: soft, non-tender; bowel sounds normal; no masses,  no organomegaly Extremities: extremities normal, atraumatic, no cyanosis or edema Skin: Skin color, texture, turgor normal. No rashes or lesions Lymph nodes: Cervical, supraclavicular, and axillary nodes normal. No abnormal inguinal nodes palpated Neurologic: Grossly normal   Pelvic: External genitalia:  no lesions              Urethra:  normal appearing urethra with no masses, tenderness or lesions              Bartholins and Skenes: normal                 Vagina: normal appearing vagina with normal color and no discharge, no lesions              Cervix: absent              Pap taken: No. Bimanual Exam:  Uterus:  uterus absent              Adnexa: no mass, fullness, tenderness               Rectovaginal:  deferred due to upcoming colonoscopy  Chaperone, Octaviano Batty, CMA, was present for exam.  Assessment/Plan: 1. Well woman exam with routine gynecological exam - Pap smear not indciated - Mammogram up to date - Colonoscopy scheduled for 08/2021 - Bone mineral density not indicated - lab work done with PCP - vaccines reviewed/updated  2. Vasomotor symptoms due to menopause - PARoxetine (PAXIL) 10 MG tablet; Take 1 tablet (10 mg total) by mouth every morning.  Dispense: 30 tablet; Refill: 2  3. Migraine with aura and without status migrainosus, not intractable - much improved  4. H/O: hysterectomy  5. Family history of colon cancer

## 2022-07-04 ENCOUNTER — Other Ambulatory Visit (HOSPITAL_BASED_OUTPATIENT_CLINIC_OR_DEPARTMENT_OTHER): Payer: Self-pay | Admitting: *Deleted

## 2022-07-04 ENCOUNTER — Other Ambulatory Visit (HOSPITAL_BASED_OUTPATIENT_CLINIC_OR_DEPARTMENT_OTHER): Payer: Self-pay | Admitting: Obstetrics & Gynecology

## 2022-07-04 DIAGNOSIS — N951 Menopausal and female climacteric states: Secondary | ICD-10-CM

## 2022-07-26 ENCOUNTER — Ambulatory Visit (INDEPENDENT_AMBULATORY_CARE_PROVIDER_SITE_OTHER): Payer: BC Managed Care – PPO

## 2022-07-26 DIAGNOSIS — R55 Syncope and collapse: Secondary | ICD-10-CM

## 2022-07-26 LAB — CUP PACEART REMOTE DEVICE CHECK
Battery Voltage: 55
Date Time Interrogation Session: 20240105073324
Implantable Lead Connection Status: 753985
Implantable Lead Connection Status: 753985
Implantable Lead Implant Date: 20180716
Implantable Lead Implant Date: 20180716
Implantable Lead Location: 753859
Implantable Lead Location: 753860
Implantable Lead Model: 5076
Implantable Lead Model: 5076
Implantable Pulse Generator Implant Date: 20180716
Pulse Gen Model: 407145
Pulse Gen Serial Number: 69161362

## 2022-08-12 ENCOUNTER — Telehealth: Payer: Self-pay | Admitting: *Deleted

## 2022-08-12 NOTE — Telephone Encounter (Signed)
1st attempt to reach pt regarding surgical clearance.  Left a message for pt to call back and ask for the preop team.  

## 2022-08-12 NOTE — Telephone Encounter (Signed)
   Name: Erika [name]  DOB: Jul 13, 1971  MRN: [id number]  Primary Cardiologist: None   Preoperative team, please contact this patient and set up a phone call appointment for further preoperative risk assessment. Please obtain consent and complete medication review. Thank you for your help.  I confirm that guidance regarding antiplatelet and oral anticoagulation therapy has been completed and, if necessary, noted below (none requested).    Lenna Sciara, NP 08/12/2022, 8:59 AM [facility]

## 2022-08-12 NOTE — Telephone Encounter (Signed)
   Pre-operative Risk Assessment    Patient Name: Erika [name]  DOB: October 21, 1970 MRN: [id number]      Request for Surgical Clearance    Procedure:   COLONSCOPY  Date of Surgery:  Clearance 08/16/22                                 Surgeon:  DR. MANN Surgeon's Group or Practice Name:  [facility] Phone number:  [phone] Fax number:  [phone]   Type of Clearance Requested:   - Medical    Type of Anesthesia:   PROPFOL   Additional requests/questions:    Astrid Divine   08/12/2022, 8:18 AM

## 2022-08-13 ENCOUNTER — Telehealth: Payer: Self-pay | Admitting: *Deleted

## 2022-08-13 ENCOUNTER — Ambulatory Visit: Payer: BC Managed Care – PPO | Attending: Nurse Practitioner | Admitting: Nurse Practitioner

## 2022-08-13 DIAGNOSIS — Z01810 Encounter for preprocedural cardiovascular examination: Secondary | ICD-10-CM

## 2022-08-13 NOTE — Progress Notes (Signed)
Remote pacemaker transmission.   

## 2022-08-13 NOTE — Telephone Encounter (Signed)
Pt agreeable to tele appt today 08/13/22, add on due to date of procedure.        Patient Consent for Virtual Visit        Erika [name] has provided verbal consent on 08/13/2022 for a virtual visit (video or telephone).   CONSENT FOR VIRTUAL VISIT FOR:  Erika [name]  By participating in this virtual visit I agree to the following:  I hereby voluntarily request, consent and authorize [facility] and its employed or contracted physicians, physician assistants, nurse practitioners or other licensed health care professionals (the Practitioner), to provide me with telemedicine health care services (the "Services") as deemed necessary by the treating Practitioner. I acknowledge and consent to receive the Services by the Practitioner via telemedicine. I understand that the telemedicine visit will involve communicating with the Practitioner through live audiovisual communication technology and the disclosure of certain medical information by electronic transmission. I acknowledge that I have been given the opportunity to request an in-person assessment or other available alternative prior to the telemedicine visit and am voluntarily participating in the telemedicine visit.  I understand that I have the right to withhold or withdraw my consent to the use of telemedicine in the course of my care at any time, without affecting my right to future care or treatment, and that the Practitioner or I may terminate the telemedicine visit at any time. I understand that I have the right to inspect all information obtained and/or recorded in the course of the telemedicine visit and may receive copies of available information for a reasonable fee.  I understand that some of the potential risks of receiving the Services via telemedicine include:  Delay or interruption in medical evaluation due to technological equipment failure or disruption; Information transmitted may not be sufficient (e.g. poor resolution  of images) to allow for appropriate medical decision making by the Practitioner; and/or  In rare instances, security protocols could fail, causing a breach of personal health information.  Furthermore, I acknowledge that it is my responsibility to provide information about my medical history, conditions and care that is complete and accurate to the best of my ability. I acknowledge that Practitioner's advice, recommendations, and/or decision may be based on factors not within their control, such as incomplete or inaccurate data provided by me or distortions of diagnostic images or specimens that may result from electronic transmissions. I understand that the practice of medicine is not an exact science and that Practitioner makes no warranties or guarantees regarding treatment outcomes. I acknowledge that a copy of this consent can be made available to me via my patient portal ([facility]), or I can request a printed copy by calling the office of [facility].    I understand that my insurance will be billed for this visit.   I have read or had this consent read to me. I understand the contents of this consent, which adequately explains the benefits and risks of the Services being provided via telemedicine.  I have been provided ample opportunity to ask questions regarding this consent and the Services and have had my questions answered to my satisfaction. I give my informed consent for the services to be provided through the use of telemedicine in my medical care

## 2022-08-13 NOTE — Telephone Encounter (Signed)
Pt agreeable to tele appt today 08/13/22, add on due to date of procedure.

## 2022-08-13 NOTE — Progress Notes (Signed)
Virtual Visit via Telephone Note   Because of Racquel Arkin [name]'s co-morbid illnesses, she is at least at moderate risk for complications without adequate follow up.  This format is felt to be most appropriate for this patient at this time.  The patient did not have access to video technology/had technical difficulties with video requiring transitioning to audio format only (telephone).  All issues noted in this document were discussed and addressed.  No physical exam could be performed with this format.  Please refer to the patient's chart for her consent to telehealth for [facility].  Evaluation Performed:  Preoperative cardiovascular risk assessment _____________   Date:  08/13/2022   Patient ID:  Erika [name], DOB 1970/07/29, MRN [id number] Patient Location:  Home Provider location:   Office  Primary Care Provider:  Teressa Senter, FNP Primary Cardiologist:  None  Chief Complaint / Patient Profile   52 y.o. 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 Collene Mares of [facility] on 08/22/2022 and presents today for telephonic preoperative cardiovascular risk assessment.  History of Present Illness    Erika [name] is a 52 y.o. female who presents via audio/video conferencing for a telehealth visit today.  Pt was last seen in cardiology clinic on 05/15/2022 by Dr. Caryl Comes.  At that time EMELIA SANDOVAL was doing well.  The patient is now pending procedure as outlined above. Since her last visit, she has done well from a cardiac standpoint.   She denies chest pain, palpitations, dyspnea, pnd, orthopnea, n, v, dizziness, syncope, edema, weight gain, or early satiety. All other systems reviewed and are otherwise negative except as noted above.   Past Medical History    Past Medical History:  Diagnosis Date   Abnormal uterine bleeding    Complicated migraine 9/38/1017   Heart murmur    Migraine with aura  02/12/2013   Presence of permanent cardiac pacemaker    Stress    saw neurologist 6/15 for numbess in face and arm   Ventricular tachycardia seen on cardiac monitor [facility])    Past Surgical History:  Procedure Laterality Date   [place]; 1997   CYSTOSCOPY  09/10/2011   Procedure: CYSTOSCOPY;  Surgeon: Felipa Emory, MD;  Location: [place] [facility];  Service: Gynecology;  Laterality: N/A;   DILATION AND CURETTAGE OF UTERUS     ENDOMETRIAL ABLATION     failed - done in MD office   EP IMPLANTABLE DEVICE N/A 02/07/2016   Procedure: Loop Recorder Insertion;  Surgeon: Deboraha Sprang, MD;  Location: [place] [facility];  Service: Cardiovascular;  Laterality: N/A;   INSERT / REPLACE / REMOVE PACEMAKER  02/03/2017   LAPAROSCOPIC TOTAL HYSTERECTOMY     robotic   LOOP RECORDER REMOVAL  02/03/2017   LOOP RECORDER REMOVAL N/A 02/03/2017   Procedure: Loop Recorder Removal;  Surgeon: Deboraha Sprang, MD;  Location: [place] [facility];  Service: Cardiovascular;  Laterality: N/A;   PACEMAKER IMPLANT N/A 02/03/2017   Procedure: Pacemaker Implant;  Surgeon: Deboraha Sprang, MD;  Location: [place] [facility];  Service: Cardiovascular;  Laterality: N/A;   TUBAL LIGATION  1997   WISDOM TOOTH EXTRACTION      Allergies  Allergies  Allergen Reactions   Penicillins Rash    Has patient had a PCN reaction causing immediate rash, facial/tongue/throat swelling, SOB or lightheadedness with hypotension: Yes Has patient had a PCN  reaction causing severe rash involving mucus membranes or skin necrosis: No Has patient had a PCN reaction that required hospitalization No Has patient had a PCN reaction occurring within the last 10 years: Yes If all of the above answers are "NO", then may proceed with Cephalosporin use.  Has patient had a PCN reaction causing immediate rash, facial/tongue/throat swelling, SOB or lightheadedness with hypotension: Yes Has patient had a PCN reaction causing severe rash involving mucus  membranes or skin necrosis: No Has patient had a PCN reaction that required hospitalization No Has patient had a PCN reaction occurring within the last 10 years: Yes If all of the above answers are "NO", then may proceed with Cephalosporin use.   Zomig [Zolmitriptan] Rash    Home Medications    Prior to Admission medications   Medication Sig Start Date End Date Taking? Authorizing Provider  Calcium Carbonate-Vit D-Min (CALCIUM 1200 PO) Take by mouth daily.    [provider]  fluticasone (CUTIVATE) 0.05 % cream as needed.    [provider]  PARoxetine (PAXIL) 10 MG tablet TAKE 1 TABLET (10 MG TOTAL) BY MOUTH EVERY MORNING. 07/04/22   Megan Salon, MD  Probiotic Product (PROBIOTIC DAILY PO) Take 1 capsule by mouth daily.    [provider]  triamcinolone cream (KENALOG) 0.5 % Apply 1 application topically 3 (three) times daily. 08/21/18   Megan Salon, MD    Physical Exam    Vital Signs:  JESSIE COWHER does not have vital signs available for review today.  Given telephonic nature of communication, physical exam is limited. AAOx3. NAD. Normal affect.  Speech and respirations are unlabored.  Accessory Clinical Findings    None  Assessment & Plan    1.  Preoperative Cardiovascular Risk Assessment:  According to the Revised Cardiac Risk Index (RCRI), her Perioperative Risk of Major Cardiac Event is (%): 0.4. Her Functional Capacity in METs is: 8.97 according to the Duke Activity Status Index (DASI).Therefore, based on ACC/AHA guidelines, patient would be at acceptable risk for the planned procedure without further cardiovascular testing.  The patient was advised that if she develops new symptoms prior to surgery to contact our office to arrange for a follow-up visit, and she verbalized understanding.   A copy of this note will be routed to requesting surgeon.  Time:   Today, I have spent 5 minutes with the patient with telehealth technology discussing  medical history, symptoms, and management plan.     Lenna Sciara, NP  08/13/2022, 4:51 PM

## 2022-08-22 DIAGNOSIS — R7989 Other specified abnormal findings of blood chemistry: Secondary | ICD-10-CM | POA: Diagnosis not present

## 2022-08-23 DIAGNOSIS — Z1211 Encounter for screening for malignant neoplasm of colon: Secondary | ICD-10-CM | POA: Diagnosis not present

## 2022-08-23 DIAGNOSIS — E669 Obesity, unspecified: Secondary | ICD-10-CM | POA: Diagnosis not present

## 2022-08-23 DIAGNOSIS — D128 Benign neoplasm of rectum: Secondary | ICD-10-CM | POA: Diagnosis not present

## 2022-08-23 DIAGNOSIS — K621 Rectal polyp: Secondary | ICD-10-CM | POA: Diagnosis not present

## 2022-10-25 ENCOUNTER — Ambulatory Visit (INDEPENDENT_AMBULATORY_CARE_PROVIDER_SITE_OTHER): Payer: BC Managed Care – PPO

## 2022-10-25 DIAGNOSIS — R55 Syncope and collapse: Secondary | ICD-10-CM | POA: Diagnosis not present

## 2022-10-25 LAB — CUP PACEART REMOTE DEVICE CHECK
Battery Voltage: 50
Date Time Interrogation Session: 20240405074438
Implantable Lead Connection Status: 753985
Implantable Lead Connection Status: 753985
Implantable Lead Implant Date: 20180716
Implantable Lead Implant Date: 20180716
Implantable Lead Location: 753859
Implantable Lead Location: 753860
Implantable Lead Model: 5076
Implantable Lead Model: 5076
Implantable Pulse Generator Implant Date: 20180716
Pulse Gen Model: 407145
Pulse Gen Serial Number: 69161362

## 2022-10-27 ENCOUNTER — Other Ambulatory Visit (HOSPITAL_BASED_OUTPATIENT_CLINIC_OR_DEPARTMENT_OTHER): Payer: Self-pay | Admitting: Obstetrics & Gynecology

## 2022-10-27 DIAGNOSIS — N951 Menopausal and female climacteric states: Secondary | ICD-10-CM

## 2022-11-26 DIAGNOSIS — D225 Melanocytic nevi of trunk: Secondary | ICD-10-CM | POA: Diagnosis not present

## 2022-11-26 DIAGNOSIS — D485 Neoplasm of uncertain behavior of skin: Secondary | ICD-10-CM | POA: Diagnosis not present

## 2022-11-26 DIAGNOSIS — L814 Other melanin hyperpigmentation: Secondary | ICD-10-CM | POA: Diagnosis not present

## 2022-11-26 DIAGNOSIS — D2372 Other benign neoplasm of skin of left lower limb, including hip: Secondary | ICD-10-CM | POA: Diagnosis not present

## 2022-11-26 DIAGNOSIS — L821 Other seborrheic keratosis: Secondary | ICD-10-CM | POA: Diagnosis not present

## 2022-11-26 NOTE — Progress Notes (Signed)
Remote pacemaker transmission.   

## 2022-11-28 ENCOUNTER — Ambulatory Visit (INDEPENDENT_AMBULATORY_CARE_PROVIDER_SITE_OTHER): Payer: BC Managed Care – PPO | Admitting: Obstetrics & Gynecology

## 2022-11-28 ENCOUNTER — Encounter (HOSPITAL_BASED_OUTPATIENT_CLINIC_OR_DEPARTMENT_OTHER): Payer: Self-pay | Admitting: Obstetrics & Gynecology

## 2022-11-28 VITALS — BP 136/82 | HR 101 | Ht 63.0 in | Wt 179.8 lb

## 2022-11-28 DIAGNOSIS — M7918 Myalgia, other site: Secondary | ICD-10-CM | POA: Diagnosis not present

## 2022-11-28 NOTE — Patient Instructions (Signed)
[facility] at [facility] [address] [place], [place] [address] Phone: [phone]

## 2022-11-28 NOTE — Progress Notes (Signed)
GYNECOLOGY  VISIT  CC:   buttocks and vaginal pain  HPI: 52 y.o. G40P2012 Married White or Caucasian female here for about two week complaint of buttocks pain that has a shooting component up and into vagina.  This started about two weekends ago.  She cannot recall any particular activity that caused symptoms but her pain was pretty significant two weekends ago with a sharp pain in her lower medial buttocks and then there was an accompanying vaginal shooting component.  She does not feel this is related to any activity.  H/o hysterectomy.  Denies vaginal bleeding or discharge.   Past Medical History:  Diagnosis Date   Abnormal uterine bleeding    Complicated migraine 02/12/2013   Heart murmur    Migraine with aura 02/12/2013   Presence of permanent cardiac pacemaker    Stress    saw neurologist 6/15 for numbess in face and arm   Ventricular tachycardia seen on cardiac monitor [facility])     MEDS:   Current Outpatient Medications on File Prior to Visit  Medication Sig Dispense Refill   Calcium Carbonate-Vit D-Min (CALCIUM 1200 PO) Take by mouth daily.     fluticasone (CUTIVATE) 0.05 % cream as needed.     PARoxetine (PAXIL) 10 MG tablet TAKE 1 TABLET (10 MG TOTAL) BY MOUTH EVERY MORNING. 90 tablet 3   Probiotic Product (PROBIOTIC DAILY PO) Take 1 capsule by mouth daily.     triamcinolone cream (KENALOG) 0.5 % Apply 1 application topically 3 (three) times daily. 30 g 0   No current facility-administered medications on file prior to visit.    ALLERGIES: Penicillins and Zomig [zolmitriptan]  SH:  married, non smoker  Review of Systems  Constitutional: Negative.   Genitourinary: Negative.     PHYSICAL EXAMINATION:    BP 136/82   Pulse (!) 101   Ht 5\' 3"  (1.6 m)   Wt 179 lb 12.8 oz (81.6 kg)   LMP 08/20/2011 (Exact Date)   BMI 31.85 kg/m     General appearance: alert, cooperative and appears stated age Lymph:  no inguinal LAD noted  Pelvic: External genitalia:  no lesions               Urethra:  normal appearing urethra with no masses, tenderness or lesions              Bartholins and Skenes: normal                 Vagina: normal appearing vagina with normal color and discharge, no lesions              Cervix: absent              Bimanual Exam:  Uterus:  uterus absent              Adnexa: no mass, fullness, tenderness              No pelvic floor tenderness Ext: pt's pain is reproduced with flexion of left hip and external rotation   Assessment/Plan: 1. Pain in left buttock - to not feel this is gyn related. - consider OTC anti-inflammatories - AMB referral to sports medicine

## 2022-11-29 NOTE — Progress Notes (Deleted)
    [name] [address] [place] [address] Phone: [phone]   Assessment and Plan:     There are no diagnoses linked to this encounter.  ***   Pertinent previous records reviewed include ***   Follow Up: ***     Subjective:   I, Erika [name], am serving as a Neurosurgeon for Erika [name]  Chief Complaint: left glute pain  HPI:   12/02/2022 Patient is a 52 year old female complaining of left glute pain. Patient states   Relevant Historical Information: ***  Additional pertinent review of systems negative.   Current Outpatient Medications:    Calcium Carbonate-Vit D-Min (CALCIUM 1200 PO), Take by mouth daily., Disp: , Rfl:    fluticasone (CUTIVATE) 0.05 % cream, as needed., Disp: , Rfl:    PARoxetine (PAXIL) 10 MG tablet, TAKE 1 TABLET (10 MG TOTAL) BY MOUTH EVERY MORNING., Disp: 90 tablet, Rfl: 3   Probiotic Product (PROBIOTIC DAILY PO), Take 1 capsule by mouth daily., Disp: , Rfl:    triamcinolone cream (KENALOG) 0.5 %, Apply 1 application topically 3 (three) times daily., Disp: 30 g, Rfl: 0   Objective:     There were no vitals filed for this visit.    There is no height or weight on file to calculate BMI.    Physical Exam:    ***   Electronically signed by:  Erika [name] [name] 7:17 AM 11/29/22

## 2022-12-02 ENCOUNTER — Ambulatory Visit: Payer: BC Managed Care – PPO | Admitting: Sports Medicine

## 2023-01-15 ENCOUNTER — Other Ambulatory Visit (HOSPITAL_BASED_OUTPATIENT_CLINIC_OR_DEPARTMENT_OTHER): Payer: Self-pay | Admitting: Obstetrics & Gynecology

## 2023-01-15 DIAGNOSIS — Z139 Encounter for screening, unspecified: Secondary | ICD-10-CM

## 2023-01-24 ENCOUNTER — Ambulatory Visit (INDEPENDENT_AMBULATORY_CARE_PROVIDER_SITE_OTHER): Payer: BC Managed Care – PPO

## 2023-01-24 DIAGNOSIS — R55 Syncope and collapse: Secondary | ICD-10-CM | POA: Diagnosis not present

## 2023-01-24 LAB — CUP PACEART REMOTE DEVICE CHECK
Battery Remaining Percentage: 50 %
Brady Statistic RA Percent Paced: 2.9 %
Brady Statistic RV Percent Paced: 11.8 %
Date Time Interrogation Session: 20240705091900
Implantable Lead Connection Status: 753985
Implantable Lead Connection Status: 753985
Implantable Lead Implant Date: 20180716
Implantable Lead Implant Date: 20180716
Implantable Lead Location: 753859
Implantable Lead Location: 753860
Implantable Lead Model: 5076
Implantable Lead Model: 5076
Implantable Pulse Generator Implant Date: 20180716
Lead Channel Impedance Value: 351 Ohm
Lead Channel Impedance Value: 468 Ohm
Lead Channel Pacing Threshold Amplitude: 0.6 V
Lead Channel Pacing Threshold Amplitude: 0.8 V
Lead Channel Pacing Threshold Pulse Width: 0.4 ms
Lead Channel Pacing Threshold Pulse Width: 0.4 ms
Lead Channel Sensing Intrinsic Amplitude: 11.8 mV
Lead Channel Sensing Intrinsic Amplitude: 2.9 mV
Lead Channel Setting Pacing Amplitude: 2 V
Lead Channel Setting Pacing Amplitude: 2.4 V
Lead Channel Setting Pacing Pulse Width: 0.4 ms
Pulse Gen Model: 407145
Pulse Gen Serial Number: 69161362

## 2023-01-27 ENCOUNTER — Telehealth: Payer: Self-pay

## 2023-01-27 NOTE — Telephone Encounter (Signed)
Biotronik remote transmission EGM's included for 01/24/23. EGM appears SVT on 01/14/23 with max VR of 182 bpm with duration 2 minutes 8 seconds. Please see result note from scheduled transmission on 01/24/23.

## 2023-01-29 NOTE — Telephone Encounter (Signed)
ntoed 

## 2023-02-12 NOTE — Progress Notes (Signed)
Remote pacemaker transmission.   

## 2023-02-26 ENCOUNTER — Ambulatory Visit (HOSPITAL_BASED_OUTPATIENT_CLINIC_OR_DEPARTMENT_OTHER): Payer: BC Managed Care – PPO | Admitting: Radiology

## 2023-02-27 ENCOUNTER — Ambulatory Visit (HOSPITAL_BASED_OUTPATIENT_CLINIC_OR_DEPARTMENT_OTHER): Payer: BC Managed Care – PPO | Admitting: Radiology

## 2023-03-05 ENCOUNTER — Other Ambulatory Visit: Payer: Self-pay | Admitting: Obstetrics & Gynecology

## 2023-03-05 DIAGNOSIS — Z1231 Encounter for screening mammogram for malignant neoplasm of breast: Secondary | ICD-10-CM

## 2023-03-06 ENCOUNTER — Inpatient Hospital Stay (HOSPITAL_BASED_OUTPATIENT_CLINIC_OR_DEPARTMENT_OTHER): Admission: RE | Admit: 2023-03-06 | Payer: BC Managed Care – PPO | Source: Ambulatory Visit | Admitting: Radiology

## 2023-03-06 DIAGNOSIS — Z1231 Encounter for screening mammogram for malignant neoplasm of breast: Secondary | ICD-10-CM

## 2023-03-12 ENCOUNTER — Other Ambulatory Visit: Payer: Self-pay | Admitting: Obstetrics & Gynecology

## 2023-03-12 DIAGNOSIS — Z1231 Encounter for screening mammogram for malignant neoplasm of breast: Secondary | ICD-10-CM

## 2023-03-13 ENCOUNTER — Ambulatory Visit (HOSPITAL_BASED_OUTPATIENT_CLINIC_OR_DEPARTMENT_OTHER)
Admission: RE | Admit: 2023-03-13 | Discharge: 2023-03-13 | Disposition: A | Discharge status: Home / Self Care | Payer: BC Managed Care – PPO | Source: Ambulatory Visit

## 2023-03-13 DIAGNOSIS — Z1231 Encounter for screening mammogram for malignant neoplasm of breast: Secondary | ICD-10-CM | POA: Insufficient documentation

## 2023-04-28 ENCOUNTER — Ambulatory Visit (INDEPENDENT_AMBULATORY_CARE_PROVIDER_SITE_OTHER): Payer: BC Managed Care – PPO

## 2023-04-28 DIAGNOSIS — R55 Syncope and collapse: Secondary | ICD-10-CM | POA: Diagnosis not present

## 2023-04-28 LAB — CUP PACEART REMOTE DEVICE CHECK
Date Time Interrogation Session: 20241007125846
Implantable Lead Connection Status: 753985
Implantable Lead Connection Status: 753985
Implantable Lead Implant Date: 20180716
Implantable Lead Implant Date: 20180716
Implantable Lead Location: 753859
Implantable Lead Location: 753860
Implantable Lead Model: 5076
Implantable Lead Model: 5076
Implantable Pulse Generator Implant Date: 20180716
Lead Channel Setting Pacing Amplitude: 2 V
Lead Channel Setting Pacing Amplitude: 2.4 V
Lead Channel Setting Pacing Pulse Width: 0.4 ms
Pulse Gen Model: 407145
Pulse Gen Serial Number: 69161362

## 2023-05-12 NOTE — Progress Notes (Signed)
Remote pacemaker transmission.   

## 2023-05-13 ENCOUNTER — Encounter: Payer: Self-pay | Admitting: Internal Medicine

## 2023-05-23 DIAGNOSIS — D509 Iron deficiency anemia, unspecified: Secondary | ICD-10-CM | POA: Diagnosis not present

## 2023-05-23 DIAGNOSIS — Z23 Encounter for immunization: Secondary | ICD-10-CM | POA: Diagnosis not present

## 2023-05-23 DIAGNOSIS — E782 Mixed hyperlipidemia: Secondary | ICD-10-CM | POA: Diagnosis not present

## 2023-05-23 DIAGNOSIS — Z1330 Encounter for screening examination for mental health and behavioral disorders, unspecified: Secondary | ICD-10-CM | POA: Diagnosis not present

## 2023-05-23 DIAGNOSIS — E559 Vitamin D deficiency, unspecified: Secondary | ICD-10-CM | POA: Diagnosis not present

## 2023-05-23 DIAGNOSIS — Z0000 Encounter for general adult medical examination without abnormal findings: Secondary | ICD-10-CM | POA: Diagnosis not present

## 2023-05-26 NOTE — Progress Notes (Unsigned)
  Electrophysiology Office Note:   ID:  Erika [name], DOB 28-Aug-1970, MRN [id number]  Primary Cardiologist: None Electrophysiologist: Sherryl Manges, MD  {Click to update primary MD,subspecialty MD or APP then REFRESH:1}    History of Present Illness:   Erika [name] is a 52 y.o. female with h/o neurally mediated syncope and palpitations seen today for routine electrophysiology followup.   Since last being seen in our clinic the patient reports doing ***.  she denies chest pain, palpitations, dyspnea, PND, orthopnea, nausea, vomiting, dizziness, syncope, edema, weight gain, or early satiety.   Review of systems complete and found to be negative unless listed in HPI.   EP Information / Studies Reviewed:    EKG is ordered today. Personal review as below.       PPM Interrogation-  reviewed in detail today,  See PACEART report.  Device History: Biotronik dual chamber PPM implanted 01/04/2017   Physical Exam:   VS:  LMP 08/20/2011 (Exact Date)    Wt Readings from Last 3 Encounters:  11/28/22 179 lb 12.8 oz (81.6 kg)  06/10/22 187 lb 12.8 oz (85.2 kg)  05/15/22 189 lb 12.8 oz (86.1 kg)     GEN: Well nourished, well developed in no acute distress NECK: No JVD; No carotid bruits CARDIAC: {EPRHYTHM:28826}, no murmurs, rubs, gallops RESPIRATORY:  Clear to auscultation without rales, wheezing or rhonchi  ABDOMEN: Soft, non-tender, non-distended EXTREMITIES:  No edema; No deformity   ASSESSMENT AND PLAN:    Neurally mediated syncope s/p Biotronik PPM  CLS pacer in place Normal PPM function See Pace Art report No changes today  SVT, long RP Tachypalpitations *** Not on BB  {Click here to Review PMH, Prob List, Meds, Allergies, SHx, FHx  :1}   Disposition:   Follow up with {EPPROVIDERS:28135} {EPFOLLOW UP:28173}  Signed, Graciella Freer, PA-C

## 2023-05-27 ENCOUNTER — Ambulatory Visit: Payer: BC Managed Care – PPO | Attending: Student | Admitting: Student

## 2023-05-27 ENCOUNTER — Encounter: Payer: Self-pay | Admitting: Student

## 2023-05-27 VITALS — BP 120/82 | HR 86 | Ht 63.0 in | Wt 180.0 lb

## 2023-05-27 DIAGNOSIS — R55 Syncope and collapse: Secondary | ICD-10-CM | POA: Diagnosis not present

## 2023-05-27 DIAGNOSIS — I471 Supraventricular tachycardia, unspecified: Secondary | ICD-10-CM | POA: Diagnosis not present

## 2023-05-27 DIAGNOSIS — Z950 Presence of cardiac pacemaker: Secondary | ICD-10-CM

## 2023-05-27 LAB — CUP PACEART INCLINIC DEVICE CHECK
Date Time Interrogation Session: 20241105121305
Implantable Lead Connection Status: 753985
Implantable Lead Connection Status: 753985
Implantable Lead Implant Date: 20180716
Implantable Lead Implant Date: 20180716
Implantable Lead Location: 753859
Implantable Lead Location: 753860
Implantable Lead Model: 5076
Implantable Lead Model: 5076
Implantable Pulse Generator Implant Date: 20180716
Pulse Gen Model: 407145
Pulse Gen Serial Number: 69161362

## 2023-05-27 NOTE — Patient Instructions (Signed)
Medication Instructions:  Your physician recommends that you continue on your current medications as directed. Please refer to the Current Medication list given to you today.  *If you need a refill on your cardiac medications before your next appointment, please call your pharmacy*  Lab Work: None ordered If you have labs (blood work) drawn today and your tests are completely normal, you will receive your results only by: MyChart Message (if you have MyChart) OR A paper copy in the mail If you have any lab test that is abnormal or we need to change your treatment, we will call you to review the results.  Follow-Up: At [facility], you and your health needs are our priority.  As part of our continuing mission to provide you with exceptional heart care, we have created designated Provider Care Teams.  These Care Teams include your primary Cardiologist (physician) and Advanced Practice Providers (APPs -  Physician Assistants and Nurse Practitioners) who all work together to provide you with the care you need, when you need it.  Your next appointment:   1 year(s)  Provider:   Casimiro Needle "Otilio Saber, PA-C

## 2023-06-11 ENCOUNTER — Encounter: Payer: BC Managed Care – PPO | Admitting: Internal Medicine

## 2023-06-16 ENCOUNTER — Ambulatory Visit (HOSPITAL_BASED_OUTPATIENT_CLINIC_OR_DEPARTMENT_OTHER): Payer: BC Managed Care – PPO | Admitting: Obstetrics & Gynecology

## 2023-07-09 ENCOUNTER — Ambulatory Visit (HOSPITAL_BASED_OUTPATIENT_CLINIC_OR_DEPARTMENT_OTHER): Payer: BC Managed Care – PPO | Admitting: Obstetrics & Gynecology

## 2023-07-29 ENCOUNTER — Ambulatory Visit (INDEPENDENT_AMBULATORY_CARE_PROVIDER_SITE_OTHER): Payer: BC Managed Care – PPO

## 2023-07-29 DIAGNOSIS — R55 Syncope and collapse: Secondary | ICD-10-CM | POA: Diagnosis not present

## 2023-07-30 LAB — CUP PACEART REMOTE DEVICE CHECK
Battery Voltage: 45
Date Time Interrogation Session: 20250107035248
Implantable Lead Connection Status: 753985
Implantable Lead Connection Status: 753985
Implantable Lead Implant Date: 20180716
Implantable Lead Implant Date: 20180716
Implantable Lead Location: 753859
Implantable Lead Location: 753860
Implantable Lead Model: 5076
Implantable Lead Model: 5076
Implantable Pulse Generator Implant Date: 20180716
Pulse Gen Model: 407145
Pulse Gen Serial Number: 69161362

## 2023-08-20 ENCOUNTER — Encounter: Payer: Self-pay | Admitting: Internal Medicine

## 2023-08-28 ENCOUNTER — Encounter (HOSPITAL_BASED_OUTPATIENT_CLINIC_OR_DEPARTMENT_OTHER): Payer: Self-pay | Admitting: Obstetrics & Gynecology

## 2023-08-28 ENCOUNTER — Telehealth (HOSPITAL_BASED_OUTPATIENT_CLINIC_OR_DEPARTMENT_OTHER): Payer: BC Managed Care – PPO | Admitting: Obstetrics & Gynecology

## 2023-08-28 DIAGNOSIS — N951 Menopausal and female climacteric states: Secondary | ICD-10-CM

## 2023-08-28 MED ORDER — PAROXETINE HCL 10 MG PO TABS: ORAL_TABLET | ORAL | 1 refills | Status: DC

## 2023-08-28 MED ORDER — ESTRADIOL 0.05 MG/24HR TD PTTW
1.0000 | MEDICATED_PATCH | TRANSDERMAL | 1 refills | Status: DC
Start: 2023-08-28 — End: 2023-09-23

## 2023-08-28 NOTE — Progress Notes (Signed)
 Virtual Visit via Video Note  I connected with Erika [name] on 08/28/23 at  3:55 PM EST by a video enabled telemedicine application and verified that I am speaking with the correct person using two identifiers.  Location: Patient: car Provider: office   I discussed the limitations of evaluation and management by telemedicine and the availability of in person appointments. The patient expressed understanding and agreed to proceed.  We did have trouble with audio so I called her on the phone and used that as audio while virtual visit was in place to see one another.    History of Present Illness: 53 yo G2P2 MWF for virtual visit to discuss concerns about possible menopausal symptoms.  She is tearful today when discussing her symptoms.  She feels hot all of the time and she does have some issues with night sweats.  Feels like she is freezing out her husband due to keeping the air conditioning on in the house.  She is having mood swings and increased anxiety so much so that it feels like paranoia.  Reports she accused her husband recently of cheating on her even when she knows that is not the case.  She is having a lot of brain fog as well.  Just doesn't feel like herself.  H/O SVT and does have loop recorder.  Echo was normal in 2017.  Feeling like she's having some palpitations when she is feeling the anxiety but reached out to cardiology and advised her recordings look normal.  Has FSH and estradiol  done in 2023.  At that time, estradiol  was 22 and FSh was elevated.     Observations/Objective: WNWD WF, NAD  Assessment and Plan: 1. Vasomotor symptoms due to menopause (Primary) - will start HRT with estradiol  at this time as well as paroxetine .  No progesterone needed due to hysterectomy history.   - will recheck 2 weeks with pt giving update.  If not significantly improved, will plan follow up visit. - estradiol  (VIVELLE -DOT) 0.05 MG/24HR patch; Place 1 patch (0.05 mg total) onto the skin 2  (two) times a week.  Dispense: 8 patch; Refill: 1 - PARoxetine  (PAXIL ) 10 MG tablet; Take 1/2 tab daily x 4 days and then take 1 tablet daily  Dispense: 30 tablet; Refill: 1   Follow Up Instructions: I discussed the assessment and treatment plan with the patient. The patient was provided an opportunity to ask questions and all were answered. The patient agreed with the plan and demonstrated an understanding of the instructions.   The patient was advised to call back or seek an in-person evaluation if the symptoms worsen or if the condition fails to improve as anticipated.  I provided 23 minutes of non-face-to-face time during this encounter.   Ronal GORMAN Pinal, MD

## 2023-08-31 ENCOUNTER — Encounter (HOSPITAL_BASED_OUTPATIENT_CLINIC_OR_DEPARTMENT_OTHER): Payer: Self-pay | Admitting: Obstetrics & Gynecology

## 2023-09-09 NOTE — Progress Notes (Signed)
 Remote pacemaker transmission.

## 2023-09-09 NOTE — Addendum Note (Signed)
Addended by: Geralyn Flash D on: 09/09/2023 01:17 PM   Modules accepted: Orders

## 2023-09-18 ENCOUNTER — Other Ambulatory Visit (HOSPITAL_BASED_OUTPATIENT_CLINIC_OR_DEPARTMENT_OTHER): Payer: Self-pay | Admitting: Obstetrics & Gynecology

## 2023-09-18 DIAGNOSIS — N951 Menopausal and female climacteric states: Secondary | ICD-10-CM

## 2023-09-23 ENCOUNTER — Encounter (HOSPITAL_BASED_OUTPATIENT_CLINIC_OR_DEPARTMENT_OTHER): Payer: Self-pay | Admitting: Obstetrics & Gynecology

## 2023-09-23 ENCOUNTER — Other Ambulatory Visit (HOSPITAL_BASED_OUTPATIENT_CLINIC_OR_DEPARTMENT_OTHER): Payer: Self-pay | Admitting: Certified Nurse Midwife

## 2023-09-23 DIAGNOSIS — N951 Menopausal and female climacteric states: Secondary | ICD-10-CM

## 2023-09-23 MED ORDER — ESTRADIOL 0.05 MG/24HR TD PTTW: 1.0000 | MEDICATED_PATCH | TRANSDERMAL | 1 refills | Status: DC

## 2023-10-14 ENCOUNTER — Ambulatory Visit (HOSPITAL_BASED_OUTPATIENT_CLINIC_OR_DEPARTMENT_OTHER): Payer: BC Managed Care – PPO | Admitting: Obstetrics & Gynecology

## 2023-10-17 DIAGNOSIS — L538 Other specified erythematous conditions: Secondary | ICD-10-CM | POA: Diagnosis not present

## 2023-10-17 DIAGNOSIS — L814 Other melanin hyperpigmentation: Secondary | ICD-10-CM | POA: Diagnosis not present

## 2023-10-17 DIAGNOSIS — L821 Other seborrheic keratosis: Secondary | ICD-10-CM | POA: Diagnosis not present

## 2023-10-17 DIAGNOSIS — D2371 Other benign neoplasm of skin of right lower limb, including hip: Secondary | ICD-10-CM | POA: Diagnosis not present

## 2023-10-17 DIAGNOSIS — D2372 Other benign neoplasm of skin of left lower limb, including hip: Secondary | ICD-10-CM | POA: Diagnosis not present

## 2023-10-17 DIAGNOSIS — L820 Inflamed seborrheic keratosis: Secondary | ICD-10-CM | POA: Diagnosis not present

## 2023-10-29 ENCOUNTER — Ambulatory Visit (INDEPENDENT_AMBULATORY_CARE_PROVIDER_SITE_OTHER): Payer: BC Managed Care – PPO

## 2023-10-29 DIAGNOSIS — R55 Syncope and collapse: Secondary | ICD-10-CM | POA: Diagnosis not present

## 2023-10-30 DIAGNOSIS — M62830 Muscle spasm of back: Secondary | ICD-10-CM | POA: Diagnosis not present

## 2023-10-30 DIAGNOSIS — M9904 Segmental and somatic dysfunction of sacral region: Secondary | ICD-10-CM | POA: Diagnosis not present

## 2023-10-30 DIAGNOSIS — M9905 Segmental and somatic dysfunction of pelvic region: Secondary | ICD-10-CM | POA: Diagnosis not present

## 2023-10-30 DIAGNOSIS — M9903 Segmental and somatic dysfunction of lumbar region: Secondary | ICD-10-CM | POA: Diagnosis not present

## 2023-10-30 LAB — CUP PACEART REMOTE DEVICE CHECK
Battery Voltage: 45
Date Time Interrogation Session: 20250410081222
Implantable Lead Connection Status: 753985
Implantable Lead Connection Status: 753985
Implantable Lead Implant Date: 20180716
Implantable Lead Implant Date: 20180716
Implantable Lead Location: 753859
Implantable Lead Location: 753860
Implantable Lead Model: 5076
Implantable Lead Model: 5076
Implantable Pulse Generator Implant Date: 20180716
Pulse Gen Model: 407145
Pulse Gen Serial Number: 69161362

## 2023-11-03 DIAGNOSIS — M9903 Segmental and somatic dysfunction of lumbar region: Secondary | ICD-10-CM | POA: Diagnosis not present

## 2023-11-03 DIAGNOSIS — M62830 Muscle spasm of back: Secondary | ICD-10-CM | POA: Diagnosis not present

## 2023-11-03 DIAGNOSIS — M9904 Segmental and somatic dysfunction of sacral region: Secondary | ICD-10-CM | POA: Diagnosis not present

## 2023-11-03 DIAGNOSIS — M9905 Segmental and somatic dysfunction of pelvic region: Secondary | ICD-10-CM | POA: Diagnosis not present

## 2023-11-05 DIAGNOSIS — M62830 Muscle spasm of back: Secondary | ICD-10-CM | POA: Diagnosis not present

## 2023-11-05 DIAGNOSIS — M9903 Segmental and somatic dysfunction of lumbar region: Secondary | ICD-10-CM | POA: Diagnosis not present

## 2023-11-05 DIAGNOSIS — M9904 Segmental and somatic dysfunction of sacral region: Secondary | ICD-10-CM | POA: Diagnosis not present

## 2023-11-05 DIAGNOSIS — M9905 Segmental and somatic dysfunction of pelvic region: Secondary | ICD-10-CM | POA: Diagnosis not present

## 2023-11-06 DIAGNOSIS — M9903 Segmental and somatic dysfunction of lumbar region: Secondary | ICD-10-CM | POA: Diagnosis not present

## 2023-11-06 DIAGNOSIS — M9904 Segmental and somatic dysfunction of sacral region: Secondary | ICD-10-CM | POA: Diagnosis not present

## 2023-11-06 DIAGNOSIS — M9905 Segmental and somatic dysfunction of pelvic region: Secondary | ICD-10-CM | POA: Diagnosis not present

## 2023-11-06 DIAGNOSIS — M62830 Muscle spasm of back: Secondary | ICD-10-CM | POA: Diagnosis not present

## 2023-11-08 ENCOUNTER — Encounter: Payer: Self-pay | Admitting: Internal Medicine

## 2023-11-11 ENCOUNTER — Encounter (HOSPITAL_BASED_OUTPATIENT_CLINIC_OR_DEPARTMENT_OTHER): Payer: Self-pay | Admitting: Obstetrics & Gynecology

## 2023-11-12 DIAGNOSIS — M9904 Segmental and somatic dysfunction of sacral region: Secondary | ICD-10-CM | POA: Diagnosis not present

## 2023-11-12 DIAGNOSIS — M9905 Segmental and somatic dysfunction of pelvic region: Secondary | ICD-10-CM | POA: Diagnosis not present

## 2023-11-12 DIAGNOSIS — M9903 Segmental and somatic dysfunction of lumbar region: Secondary | ICD-10-CM | POA: Diagnosis not present

## 2023-11-12 DIAGNOSIS — M62830 Muscle spasm of back: Secondary | ICD-10-CM | POA: Diagnosis not present

## 2023-11-12 NOTE — Telephone Encounter (Signed)
 Called pt in response to myChart message. Advised that imaging is not typically done every 6 months for women on HRT, annual screening is recommended. Advised that a breast exam by provider could be done first. Advised that HRT can cause some breast tenderness and if it is bothersome, the dosage of the patch she is using can be decreased. Pt reports that the tenderness has not been present the last 2 days. She has an appt on May 5 and will have breast exam done at that visit. If the tenderness returns before then, she will call to get scheduled sooner.

## 2023-11-17 DIAGNOSIS — M9905 Segmental and somatic dysfunction of pelvic region: Secondary | ICD-10-CM | POA: Diagnosis not present

## 2023-11-17 DIAGNOSIS — M9903 Segmental and somatic dysfunction of lumbar region: Secondary | ICD-10-CM | POA: Diagnosis not present

## 2023-11-17 DIAGNOSIS — M62830 Muscle spasm of back: Secondary | ICD-10-CM | POA: Diagnosis not present

## 2023-11-17 DIAGNOSIS — M9904 Segmental and somatic dysfunction of sacral region: Secondary | ICD-10-CM | POA: Diagnosis not present

## 2023-11-19 DIAGNOSIS — M9905 Segmental and somatic dysfunction of pelvic region: Secondary | ICD-10-CM | POA: Diagnosis not present

## 2023-11-19 DIAGNOSIS — M62830 Muscle spasm of back: Secondary | ICD-10-CM | POA: Diagnosis not present

## 2023-11-19 DIAGNOSIS — M9903 Segmental and somatic dysfunction of lumbar region: Secondary | ICD-10-CM | POA: Diagnosis not present

## 2023-11-19 DIAGNOSIS — M9904 Segmental and somatic dysfunction of sacral region: Secondary | ICD-10-CM | POA: Diagnosis not present

## 2023-11-21 ENCOUNTER — Encounter (HOSPITAL_BASED_OUTPATIENT_CLINIC_OR_DEPARTMENT_OTHER): Payer: Self-pay | Admitting: Obstetrics & Gynecology

## 2023-11-21 ENCOUNTER — Ambulatory Visit (HOSPITAL_BASED_OUTPATIENT_CLINIC_OR_DEPARTMENT_OTHER): Payer: BC Managed Care – PPO | Admitting: Obstetrics & Gynecology

## 2023-11-21 VITALS — BP 123/76 | HR 94 | Ht 63.0 in | Wt 188.0 lb

## 2023-11-21 DIAGNOSIS — Z01419 Encounter for gynecological examination (general) (routine) without abnormal findings: Secondary | ICD-10-CM

## 2023-11-21 DIAGNOSIS — Z950 Presence of cardiac pacemaker: Secondary | ICD-10-CM

## 2023-11-21 DIAGNOSIS — N951 Menopausal and female climacteric states: Secondary | ICD-10-CM | POA: Diagnosis not present

## 2023-11-21 DIAGNOSIS — Z90710 Acquired absence of both cervix and uterus: Secondary | ICD-10-CM

## 2023-11-21 MED ORDER — PAROXETINE HCL 10 MG PO TABS
10.0000 mg | ORAL_TABLET | Freq: Every day | ORAL | 3 refills | Status: AC
Start: 2023-11-21 — End: ?

## 2023-11-21 MED ORDER — ESTRADIOL 0.05 MG/24HR TD PTTW: 1.0000 | MEDICATED_PATCH | TRANSDERMAL | 4 refills | Status: AC

## 2023-11-21 NOTE — Progress Notes (Signed)
 ANNUAL EXAM Patient name: Erika [name] MRN [id number]  Date of birth: 1971-02-24 Chief Complaint:   AEX  History of Present Illness:   Erika [name] is a 53 y.o. G13P2012 Caucasian female being seen today for a routine annual exam. Doing well.  Denies vaginal bleeding.    Patient's last menstrual period was 08/20/2011 (exact date).   Last pap  not indicated Last mammogram: 03/13/2023 . Results were: normal. Family h/o breast cancer: paternal aunt Last colonoscopy: 08/27/2022.   Results were: normal. Follow up 5 years.  Family h/o colorectal cancer: yes :  paternal cousin, maternal aunt     11/21/2023   11:01 AM 08/28/2023    3:41 PM 06/10/2022    1:40 PM 05/03/2021    2:06 PM 11/06/2015   11:22 AM  Depression screen PHQ 2/9  Decreased Interest 0 2 0 0 0  Down, Depressed, Hopeless 0 1 0 0 0  PHQ - 2 Score 0 3 0 0 0  Altered sleeping  2     Tired, decreased energy  1     Change in appetite  1     Feeling bad or failure about yourself   1     Trouble concentrating  1     Moving slowly or fidgety/restless  1     Suicidal thoughts  0     PHQ-9 Score  10     Difficult doing work/chores  Somewhat difficult        Review of Systems:   Pertinent items are noted in HPI  Denies any vaginal discharge, bowel or bladder changes Pertinent History Reviewed:  Reviewed past medical,surgical, social and family history.  Reviewed problem list, medications and allergies. Physical Assessment:   Vitals:   11/21/23 1059  BP: 123/76  Pulse: 94  Weight: 188 lb (85.3 kg)  Height: 5\' 3"  (1.6 m)  Body mass index is 33.3 kg/m.        Physical Examination:   General appearance - well appearing, and in no distress  Mental status - alert, oriented to person, place, and time  Psych:  She has a normal mood and affect  Skin - warm and dry, normal color, no suspicious lesions noted  Chest - effort normal, all lung fields clear to auscultation bilaterally  Heart - normal rate and regular  rhythm  Neck:  midline trachea, no thyromegaly or nodules  Breasts - breasts appear normal, no suspicious masses, no skin or nipple changes or  axillary nodes  Abdomen - soft, nontender, nondistended, no masses or organomegaly  Pelvic - VULVA: normal appearing vulva with no masses, tenderness or lesions    VAGINA: normal appearing vagina with normal color and discharge, no lesions    CERVIX: surgically absent  Thin prep pap is not indicated  UTERUS: uterus is felt to be normal size, shape, consistency and nontender   ADNEXA: No adnexal masses or tenderness noted.  Rectal - normal rectal, good sphincter tone, no masses felt.  Extremities:  No swelling or varicosities noted  Chaperone present for exam Assessment & Plan:  1. Well woman exam with routine gynecological exam (Primary) - Pap smear not indicated - Mammogram 02/2023 - Colonoscopy 08/2022.  Will have release signed for this.   - lab work done 05/23/2023 - vaccines reviewed/updated  2. Vasomotor symptoms due to menopause - estradiol  (VIVELLE -DOT) 0.05 MG/24HR patch; Place 1 patch (0.05 mg total) onto the skin 2 (two) times a week.  Dispense: 24 patch; Refill: 4 -  PARoxetine  (PAXIL ) 10 MG tablet; Take 1 tablet (10 mg total) by mouth daily. Take 1/2 tab daily x 4 days and then take 1 tablet daily  Dispense: 90 tablet; Refill: 3  3. H/O: hysterectomy  4. Pacemaker - followed by Dr. Rodolfo Clan  Meds:  Meds ordered this encounter  Medications   estradiol  (VIVELLE -DOT) 0.05 MG/24HR patch    Sig: Place 1 patch (0.05 mg total) onto the skin 2 (two) times a week.    Dispense:  24 patch    Refill:  4   PARoxetine  (PAXIL ) 10 MG tablet    Sig: Take 1 tablet (10 mg total) by mouth daily. Take 1/2 tab daily x 4 days and then take 1 tablet daily    Dispense:  90 tablet    Refill:  3    Follow-up: Return in about 1 year (around 11/20/2024).  Lillian Rein, MD 11/21/2023 11:50 AM

## 2023-11-24 DIAGNOSIS — M9903 Segmental and somatic dysfunction of lumbar region: Secondary | ICD-10-CM | POA: Diagnosis not present

## 2023-11-24 DIAGNOSIS — M9904 Segmental and somatic dysfunction of sacral region: Secondary | ICD-10-CM | POA: Diagnosis not present

## 2023-11-24 DIAGNOSIS — M9905 Segmental and somatic dysfunction of pelvic region: Secondary | ICD-10-CM | POA: Diagnosis not present

## 2023-11-24 DIAGNOSIS — M62830 Muscle spasm of back: Secondary | ICD-10-CM | POA: Diagnosis not present

## 2023-11-26 ENCOUNTER — Encounter (HOSPITAL_BASED_OUTPATIENT_CLINIC_OR_DEPARTMENT_OTHER): Payer: Self-pay | Admitting: Obstetrics & Gynecology

## 2023-11-26 ENCOUNTER — Encounter (HOSPITAL_BASED_OUTPATIENT_CLINIC_OR_DEPARTMENT_OTHER): Payer: Self-pay | Admitting: *Deleted

## 2023-11-27 DIAGNOSIS — M62830 Muscle spasm of back: Secondary | ICD-10-CM | POA: Diagnosis not present

## 2023-11-27 DIAGNOSIS — M9904 Segmental and somatic dysfunction of sacral region: Secondary | ICD-10-CM | POA: Diagnosis not present

## 2023-11-27 DIAGNOSIS — M9905 Segmental and somatic dysfunction of pelvic region: Secondary | ICD-10-CM | POA: Diagnosis not present

## 2023-11-27 DIAGNOSIS — M9903 Segmental and somatic dysfunction of lumbar region: Secondary | ICD-10-CM | POA: Diagnosis not present

## 2023-12-01 DIAGNOSIS — M9904 Segmental and somatic dysfunction of sacral region: Secondary | ICD-10-CM | POA: Diagnosis not present

## 2023-12-01 DIAGNOSIS — M9905 Segmental and somatic dysfunction of pelvic region: Secondary | ICD-10-CM | POA: Diagnosis not present

## 2023-12-01 DIAGNOSIS — M62830 Muscle spasm of back: Secondary | ICD-10-CM | POA: Diagnosis not present

## 2023-12-01 DIAGNOSIS — M9903 Segmental and somatic dysfunction of lumbar region: Secondary | ICD-10-CM | POA: Diagnosis not present

## 2023-12-05 DIAGNOSIS — H2513 Age-related nuclear cataract, bilateral: Secondary | ICD-10-CM | POA: Diagnosis not present

## 2023-12-05 DIAGNOSIS — Z83518 Family history of other specified eye disorder: Secondary | ICD-10-CM | POA: Diagnosis not present

## 2023-12-05 DIAGNOSIS — H04123 Dry eye syndrome of bilateral lacrimal glands: Secondary | ICD-10-CM | POA: Diagnosis not present

## 2023-12-05 DIAGNOSIS — H524 Presbyopia: Secondary | ICD-10-CM | POA: Diagnosis not present

## 2023-12-12 NOTE — Addendum Note (Signed)
 Addended by: Lott Rouleau A on: 12/12/2023 11:55 AM   Modules accepted: Orders

## 2023-12-12 NOTE — Progress Notes (Signed)
 Remote pacemaker transmission.

## 2024-01-29 ENCOUNTER — Ambulatory Visit (INDEPENDENT_AMBULATORY_CARE_PROVIDER_SITE_OTHER): Payer: BC Managed Care – PPO

## 2024-01-29 DIAGNOSIS — R55 Syncope and collapse: Secondary | ICD-10-CM | POA: Diagnosis not present

## 2024-02-13 LAB — CUP PACEART REMOTE DEVICE CHECK
Battery Remaining Percentage: 40 %
Brady Statistic AP VP Percent: 0 %
Brady Statistic AP VS Percent: 39 %
Brady Statistic AS VP Percent: 0 %
Brady Statistic AS VS Percent: 61 %
Brady Statistic RA Percent Paced: 39 %
Brady Statistic RV Percent Paced: 0 %
Date Time Interrogation Session: 20250710234535
Implantable Lead Connection Status: 753985
Implantable Lead Connection Status: 753985
Implantable Lead Implant Date: 20180716
Implantable Lead Implant Date: 20180716
Implantable Lead Location: 753859
Implantable Lead Location: 753860
Implantable Lead Model: 5076
Implantable Lead Model: 5076
Implantable Pulse Generator Implant Date: 20180716
Lead Channel Impedance Value: 351 Ohm
Lead Channel Impedance Value: 429 Ohm
Lead Channel Pacing Threshold Amplitude: 0.8 V
Lead Channel Pacing Threshold Amplitude: 1 V
Lead Channel Pacing Threshold Pulse Width: 0.4 ms
Lead Channel Pacing Threshold Pulse Width: 0.4 ms
Lead Channel Setting Pacing Amplitude: 2 V
Lead Channel Setting Pacing Amplitude: 2.4 V
Lead Channel Setting Pacing Pulse Width: 0.4 ms
Pulse Gen Model: 407145
Pulse Gen Serial Number: 69161362

## 2024-02-20 DIAGNOSIS — Z1330 Encounter for screening examination for mental health and behavioral disorders, unspecified: Secondary | ICD-10-CM | POA: Diagnosis not present

## 2024-02-20 DIAGNOSIS — H6692 Otitis media, unspecified, left ear: Secondary | ICD-10-CM | POA: Diagnosis not present

## 2024-02-21 ENCOUNTER — Ambulatory Visit: Payer: Self-pay | Admitting: Cardiology

## 2024-03-12 ENCOUNTER — Inpatient Hospital Stay (HOSPITAL_BASED_OUTPATIENT_CLINIC_OR_DEPARTMENT_OTHER): Admission: RE | Admit: 2024-03-12 | Source: Ambulatory Visit | Admitting: Radiology

## 2024-03-12 DIAGNOSIS — Z1231 Encounter for screening mammogram for malignant neoplasm of breast: Secondary | ICD-10-CM

## 2024-03-13 ENCOUNTER — Other Ambulatory Visit (HOSPITAL_BASED_OUTPATIENT_CLINIC_OR_DEPARTMENT_OTHER): Payer: Self-pay | Admitting: Obstetrics & Gynecology

## 2024-03-13 DIAGNOSIS — Z1231 Encounter for screening mammogram for malignant neoplasm of breast: Secondary | ICD-10-CM

## 2024-03-15 ENCOUNTER — Other Ambulatory Visit: Payer: Self-pay | Admitting: Medical Genetics

## 2024-03-16 ENCOUNTER — Other Ambulatory Visit (HOSPITAL_COMMUNITY)
Admission: RE | Admit: 2024-03-16 | Discharge: 2024-03-16 | Disposition: A | Discharge status: Home / Self Care | Payer: Self-pay | Source: Ambulatory Visit | Attending: Medical Genetics | Admitting: Medical Genetics

## 2024-03-19 ENCOUNTER — Encounter (HOSPITAL_BASED_OUTPATIENT_CLINIC_OR_DEPARTMENT_OTHER): Payer: Self-pay | Admitting: Radiology

## 2024-03-19 ENCOUNTER — Ambulatory Visit (HOSPITAL_BASED_OUTPATIENT_CLINIC_OR_DEPARTMENT_OTHER)
Admission: RE | Admit: 2024-03-19 | Discharge: 2024-03-19 | Disposition: A | Discharge status: Home / Self Care | Source: Ambulatory Visit

## 2024-03-19 DIAGNOSIS — Z1231 Encounter for screening mammogram for malignant neoplasm of breast: Secondary | ICD-10-CM | POA: Diagnosis not present

## 2024-03-23 LAB — GENECONNECT MOLECULAR SCREEN: Genetic Analysis Overall Interpretation: NEGATIVE

## 2024-04-20 DIAGNOSIS — D2372 Other benign neoplasm of skin of left lower limb, including hip: Secondary | ICD-10-CM | POA: Diagnosis not present

## 2024-04-20 DIAGNOSIS — L814 Other melanin hyperpigmentation: Secondary | ICD-10-CM | POA: Diagnosis not present

## 2024-04-20 DIAGNOSIS — R208 Other disturbances of skin sensation: Secondary | ICD-10-CM | POA: Diagnosis not present

## 2024-04-20 DIAGNOSIS — L821 Other seborrheic keratosis: Secondary | ICD-10-CM | POA: Diagnosis not present

## 2024-04-20 DIAGNOSIS — L820 Inflamed seborrheic keratosis: Secondary | ICD-10-CM | POA: Diagnosis not present

## 2024-04-20 DIAGNOSIS — D485 Neoplasm of uncertain behavior of skin: Secondary | ICD-10-CM | POA: Diagnosis not present

## 2024-04-20 DIAGNOSIS — D225 Melanocytic nevi of trunk: Secondary | ICD-10-CM | POA: Diagnosis not present

## 2024-04-20 DIAGNOSIS — D2371 Other benign neoplasm of skin of right lower limb, including hip: Secondary | ICD-10-CM | POA: Diagnosis not present

## 2024-04-20 DIAGNOSIS — L538 Other specified erythematous conditions: Secondary | ICD-10-CM | POA: Diagnosis not present

## 2024-04-30 ENCOUNTER — Ambulatory Visit: Payer: BC Managed Care – PPO

## 2024-04-30 DIAGNOSIS — R55 Syncope and collapse: Secondary | ICD-10-CM

## 2024-04-30 LAB — CUP PACEART REMOTE DEVICE CHECK
Date Time Interrogation Session: 20251010094026
Implantable Lead Connection Status: 753985
Implantable Lead Connection Status: 753985
Implantable Lead Implant Date: 20180716
Implantable Lead Implant Date: 20180716
Implantable Lead Location: 753859
Implantable Lead Location: 753860
Implantable Lead Model: 5076
Implantable Lead Model: 5076
Implantable Pulse Generator Implant Date: 20180716
Pulse Gen Model: 407145
Pulse Gen Serial Number: 69161362

## 2024-04-30 NOTE — Progress Notes (Signed)
 Remote PPM Transmission

## 2024-05-02 ENCOUNTER — Ambulatory Visit: Payer: Self-pay | Admitting: Cardiology

## 2024-05-03 NOTE — Progress Notes (Signed)
 Remote PPM Transmission

## 2024-05-10 DIAGNOSIS — M5442 Lumbago with sciatica, left side: Secondary | ICD-10-CM | POA: Diagnosis not present

## 2024-05-10 DIAGNOSIS — M4726 Other spondylosis with radiculopathy, lumbar region: Secondary | ICD-10-CM | POA: Diagnosis not present

## 2024-05-10 DIAGNOSIS — R202 Paresthesia of skin: Secondary | ICD-10-CM | POA: Diagnosis not present

## 2024-05-10 DIAGNOSIS — R200 Anesthesia of skin: Secondary | ICD-10-CM | POA: Diagnosis not present

## 2024-05-10 DIAGNOSIS — D508 Other iron deficiency anemias: Secondary | ICD-10-CM | POA: Diagnosis not present

## 2024-05-24 DIAGNOSIS — M4722 Other spondylosis with radiculopathy, cervical region: Secondary | ICD-10-CM | POA: Diagnosis not present

## 2024-05-24 DIAGNOSIS — G894 Chronic pain syndrome: Secondary | ICD-10-CM | POA: Diagnosis not present

## 2024-05-24 DIAGNOSIS — M48061 Spinal stenosis, lumbar region without neurogenic claudication: Secondary | ICD-10-CM | POA: Diagnosis not present

## 2024-05-24 DIAGNOSIS — M4726 Other spondylosis with radiculopathy, lumbar region: Secondary | ICD-10-CM | POA: Diagnosis not present

## 2024-07-30 ENCOUNTER — Ambulatory Visit: Payer: BC Managed Care – PPO

## 2024-07-30 DIAGNOSIS — I471 Supraventricular tachycardia, unspecified: Secondary | ICD-10-CM | POA: Diagnosis not present

## 2024-08-01 LAB — CUP PACEART REMOTE DEVICE CHECK
Date Time Interrogation Session: 20260109091620
Implantable Lead Connection Status: 753985
Implantable Lead Connection Status: 753985
Implantable Lead Implant Date: 20180716
Implantable Lead Implant Date: 20180716
Implantable Lead Location: 753859
Implantable Lead Location: 753860
Implantable Lead Model: 5076
Implantable Lead Model: 5076
Implantable Pulse Generator Implant Date: 20180716
Pulse Gen Model: 407145
Pulse Gen Serial Number: 69161362

## 2024-08-02 ENCOUNTER — Ambulatory Visit: Payer: Self-pay | Admitting: Cardiology

## 2024-08-02 NOTE — Progress Notes (Signed)
 Remote PPM Transmission

## 2024-11-26 ENCOUNTER — Ambulatory Visit (HOSPITAL_BASED_OUTPATIENT_CLINIC_OR_DEPARTMENT_OTHER): Admitting: Obstetrics & Gynecology
